# Patient Record
Sex: Female | Born: 1975
Health system: Southern US, Community
[De-identification: ages and names within clinical notes are randomized; demographics above are authoritative.]

## PROBLEM LIST (undated history)

## (undated) DIAGNOSIS — Z9289 Personal history of other medical treatment: Secondary | ICD-10-CM

## (undated) DIAGNOSIS — I10 Essential (primary) hypertension: Secondary | ICD-10-CM

## (undated) DIAGNOSIS — T7840XA Allergy, unspecified, initial encounter: Secondary | ICD-10-CM

## (undated) DIAGNOSIS — K649 Unspecified hemorrhoids: Secondary | ICD-10-CM

## (undated) HISTORY — DX: Unspecified hemorrhoids: K64.9

## (undated) HISTORY — PX: OTHER SURGICAL HISTORY: SHX169

## (undated) HISTORY — DX: Allergy, unspecified, initial encounter: T78.40XA

## (undated) HISTORY — DX: Essential (primary) hypertension: I10

## (undated) HISTORY — PX: TUBAL LIGATION: SHX77

---

## 1994-05-12 DIAGNOSIS — Z9289 Personal history of other medical treatment: Secondary | ICD-10-CM

## 1994-05-12 HISTORY — DX: Personal history of other medical treatment: Z92.89

## 2010-12-12 ENCOUNTER — Emergency Department (HOSPITAL_COMMUNITY): Payer: Managed Care, Other (non HMO)

## 2010-12-12 ENCOUNTER — Emergency Department (HOSPITAL_COMMUNITY)
Admission: EM | Admit: 2010-12-12 | Discharge: 2010-12-12 | Disposition: A | Discharge status: Home / Self Care | Payer: Managed Care, Other (non HMO) | Attending: Emergency Medicine | Admitting: Emergency Medicine

## 2010-12-12 DIAGNOSIS — W01119A Fall on same level from slipping, tripping and stumbling with subsequent striking against unspecified sharp object, initial encounter: Secondary | ICD-10-CM | POA: Insufficient documentation

## 2010-12-12 DIAGNOSIS — M79609 Pain in unspecified limb: Secondary | ICD-10-CM | POA: Insufficient documentation

## 2010-12-12 DIAGNOSIS — W268XXA Contact with other sharp object(s), not elsewhere classified, initial encounter: Secondary | ICD-10-CM | POA: Insufficient documentation

## 2010-12-12 DIAGNOSIS — S51809A Unspecified open wound of unspecified forearm, initial encounter: Secondary | ICD-10-CM | POA: Insufficient documentation

## 2010-12-12 DIAGNOSIS — S61409A Unspecified open wound of unspecified hand, initial encounter: Secondary | ICD-10-CM | POA: Insufficient documentation

## 2012-01-29 ENCOUNTER — Encounter (HOSPITAL_COMMUNITY): Payer: Self-pay | Admitting: *Deleted

## 2012-02-01 ENCOUNTER — Encounter (HOSPITAL_COMMUNITY): Payer: Self-pay | Admitting: Pharmacist

## 2012-02-04 ENCOUNTER — Other Ambulatory Visit: Payer: Self-pay | Admitting: Obstetrics & Gynecology

## 2012-02-11 NOTE — H&P (Signed)
Kaitlyn Stevens is an 36 y.o. G 4 P 5 female. She is admitted for hysteroscopy and Novasure endometrial ablation.  Pertinent Gynecological History:  She has been followed by me since 2008 with intermittent episodes of menorrhagia which have required 4 office D&C's to control over those years.  Sono hysterograms show a normal uterus with possible small recurrent polyps.  Her menorrhagia has not been controlled with oral contraceptives.  She did not want to try a Mirena.      Past Medical History  Diagnosis Date  . SVD (spontaneous vaginal delivery) 1994    twins  . History of blood transfusion 1996    with c/s done in [place]    Past Surgical History  Procedure Date  . Cesarean section 1996    x 1 twins    History reviewed. No pertinent family history.  Social History:  reports that she has never smoked. She has never used smokeless tobacco. She reports that she does not drink alcohol or use illicit drugs.  Allergies: No Known Allergies  No prescriptions prior to admission    ROS: non-contributory  Height 5' 4"  (1.626 m), weight 81.647 kg (180 lb). Physical Exam:  Abdomen is soft.  Pelvic shows no pathology.  Assessment/Plan: Menorrhagia poorly controlled with oral contraceptives.  Anemia (recent Hb 8.0 gms).  Possible 1.1 cm. uterine polyp seen on ultrasound. Will do hysteroscopy and Novasure Ablation.  Dyonna Jaspers D 02/11/2012, 3:14 PM

## 2012-02-13 ENCOUNTER — Encounter (HOSPITAL_COMMUNITY)
Admission: AD | Disposition: A | Discharge status: Home / Self Care | Payer: Self-pay | Source: Ambulatory Visit | Attending: Obstetrics & Gynecology

## 2012-02-13 ENCOUNTER — Ambulatory Visit (HOSPITAL_COMMUNITY)
Admission: AD | Admit: 2012-02-13 | Discharge: 2012-02-13 | Disposition: A | Discharge status: Home / Self Care | Payer: Managed Care, Other (non HMO) | Source: Ambulatory Visit | Attending: Obstetrics & Gynecology | Admitting: Obstetrics & Gynecology

## 2012-02-13 ENCOUNTER — Encounter (HOSPITAL_COMMUNITY): Payer: Self-pay | Admitting: Anesthesiology

## 2012-02-13 ENCOUNTER — Encounter (HOSPITAL_COMMUNITY): Payer: Self-pay | Admitting: *Deleted

## 2012-02-13 ENCOUNTER — Ambulatory Visit (HOSPITAL_COMMUNITY): Payer: Managed Care, Other (non HMO) | Admitting: Anesthesiology

## 2012-02-13 DIAGNOSIS — D649 Anemia, unspecified: Secondary | ICD-10-CM | POA: Insufficient documentation

## 2012-02-13 DIAGNOSIS — N840 Polyp of corpus uteri: Secondary | ICD-10-CM | POA: Insufficient documentation

## 2012-02-13 DIAGNOSIS — N920 Excessive and frequent menstruation with regular cycle: Secondary | ICD-10-CM | POA: Insufficient documentation

## 2012-02-13 HISTORY — DX: Personal history of other medical treatment: Z92.89

## 2012-02-13 HISTORY — PX: POLYPECTOMY: SHX5525

## 2012-02-13 LAB — CBC
HCT: 25.9 % — ABNORMAL LOW (ref 36.0–46.0)
MCH: 22.9 pg — ABNORMAL LOW (ref 26.0–34.0)
MCV: 73.2 fL — ABNORMAL LOW (ref 78.0–100.0)
RDW: 16.8 % — ABNORMAL HIGH (ref 11.5–15.5)
WBC: 6.5 10*3/uL (ref 4.0–10.5)

## 2012-02-13 SURGERY — HYSTEROSCOPY WITH NOVASURE
Anesthesia: General | Site: Uterus | Wound class: Clean Contaminated

## 2012-02-13 MED ORDER — KETOROLAC TROMETHAMINE 30 MG/ML IJ SOLN
INTRAMUSCULAR | Status: DC | PRN
  Administered 2012-02-13: 30 mg via INTRAVENOUS

## 2012-02-13 MED ORDER — MIDAZOLAM HCL 5 MG/5ML IJ SOLN
INTRAMUSCULAR | Status: DC | PRN
  Administered 2012-02-13: 2 mg via INTRAVENOUS

## 2012-02-13 MED ORDER — PROPOFOL 10 MG/ML IV EMUL
INTRAVENOUS | Status: AC
  Filled 2012-02-13: qty 20

## 2012-02-13 MED ORDER — LACTATED RINGERS IV SOLN
INTRAVENOUS | Status: DC
  Administered 2012-02-13: 50 mL/h via INTRAVENOUS

## 2012-02-13 MED ORDER — FENTANYL CITRATE 0.05 MG/ML IJ SOLN: 25.0000 ug | INTRAMUSCULAR | Status: DC | PRN

## 2012-02-13 MED ORDER — LACTATED RINGERS IV SOLN
INTRAVENOUS | Status: DC | PRN
  Administered 2012-02-13: 3000 mL via INTRAUTERINE

## 2012-02-13 MED ORDER — SUCCINYLCHOLINE CHLORIDE 20 MG/ML IJ SOLN
INTRAMUSCULAR | Status: DC | PRN
  Administered 2012-02-13: 10 mg via INTRAVENOUS

## 2012-02-13 MED ORDER — SUCCINYLCHOLINE CHLORIDE 20 MG/ML IJ SOLN
INTRAMUSCULAR | Status: AC
  Filled 2012-02-13: qty 10

## 2012-02-13 MED ORDER — LIDOCAINE HCL 2 % IJ SOLN
INTRAMUSCULAR | Status: AC
  Filled 2012-02-13: qty 20

## 2012-02-13 MED ORDER — LIDOCAINE HCL (CARDIAC) 20 MG/ML IV SOLN
INTRAVENOUS | Status: DC | PRN
  Administered 2012-02-13: 30 mg via INTRAVENOUS
  Administered 2012-02-13: 40 mg via INTRAVENOUS
  Administered 2012-02-13: 30 mg via INTRAVENOUS

## 2012-02-13 MED ORDER — LIDOCAINE HCL 2 % IJ SOLN
INTRAMUSCULAR | Status: DC | PRN
  Administered 2012-02-13: 20 mL

## 2012-02-13 MED ORDER — FENTANYL CITRATE 0.05 MG/ML IJ SOLN
INTRAMUSCULAR | Status: DC | PRN
  Administered 2012-02-13 (×2): 50 ug via INTRAVENOUS

## 2012-02-13 MED ORDER — METOCLOPRAMIDE HCL 5 MG/ML IJ SOLN: 10.0000 mg | Freq: Once | INTRAMUSCULAR | Status: DC | PRN

## 2012-02-13 MED ORDER — MIDAZOLAM HCL 2 MG/2ML IJ SOLN
INTRAMUSCULAR | Status: AC
Start: 2012-02-13 — End: 2012-02-13
  Filled 2012-02-13: qty 2

## 2012-02-13 MED ORDER — PROPOFOL 10 MG/ML IV EMUL
INTRAVENOUS | Status: DC | PRN
  Administered 2012-02-13: 200 mg via INTRAVENOUS

## 2012-02-13 MED ORDER — LIDOCAINE HCL (CARDIAC) 20 MG/ML IV SOLN
INTRAVENOUS | Status: AC
  Filled 2012-02-13: qty 5

## 2012-02-13 MED ORDER — ONDANSETRON HCL 4 MG/2ML IJ SOLN
INTRAMUSCULAR | Status: DC | PRN
  Administered 2012-02-13: 4 mg via INTRAVENOUS

## 2012-02-13 MED ORDER — ONDANSETRON HCL 4 MG/2ML IJ SOLN
INTRAMUSCULAR | Status: AC
  Filled 2012-02-13: qty 2

## 2012-02-13 MED ORDER — KETOROLAC TROMETHAMINE 30 MG/ML IJ SOLN
INTRAMUSCULAR | Status: AC
  Filled 2012-02-13: qty 1

## 2012-02-13 MED ORDER — FENTANYL CITRATE 0.05 MG/ML IJ SOLN
INTRAMUSCULAR | Status: AC
  Filled 2012-02-13: qty 2

## 2012-02-13 MED ORDER — MEPERIDINE HCL 25 MG/ML IJ SOLN: 6.2500 mg | INTRAMUSCULAR | Status: DC | PRN

## 2012-02-13 SURGICAL SUPPLY — 12 items
ABLATOR ENDOMETRIAL BIPOLAR (ABLATOR) ×2 IMPLANT
CATH ROBINSON RED A/P 16FR (CATHETERS) ×2 IMPLANT
CLOTH BEACON ORANGE TIMEOUT ST (SAFETY) ×2 IMPLANT
CONTAINER PREFILL 10% NBF 60ML (FORM) ×2 IMPLANT
DRESSING TELFA 8X3 (GAUZE/BANDAGES/DRESSINGS) ×2 IMPLANT
GLOVE ECLIPSE 6.0 STRL STRAW (GLOVE) ×4 IMPLANT
GOWN STRL REIN XL XLG (GOWN DISPOSABLE) ×6 IMPLANT
PACK HYSTEROSCOPY LF (CUSTOM PROCEDURE TRAY) ×2 IMPLANT
PAD OB MATERNITY 4.3X12.25 (PERSONAL CARE ITEMS) ×2 IMPLANT
PAD PREP 24X48 CUFFED NSTRL (MISCELLANEOUS) ×2 IMPLANT
TOWEL OR 17X24 6PK STRL BLUE (TOWEL DISPOSABLE) ×4 IMPLANT
WATER STERILE IRR 1000ML POUR (IV SOLUTION) ×2 IMPLANT

## 2012-02-13 NOTE — Op Note (Signed)
Patient Name: Kaitlyn Stevens MRN: [id number]  Date of Surgery: 02/13/2012    PREOPERATIVE DIAGNOSIS: MENORRHAGIA ENDOMETRIAL POLYP  POSTOPERATIVE DIAGNOSIS: MENORRHAGIA ENDOMETRIAL POLYP   PROCEDURE: Hysteroscopy, polypectomy, Novasure endometrial ablation.  SURGEON: Kamani Magnussen D. Deatra Ina M.D.  ANESTHESIA: General  ESTIMATED BLOOD LOSS: Minimal  FINDINGS: Uterus sounded to 8.2 cm, 1 cm polyp noted.   INDICATIONS: Menorrhagia, recurrent and not controlled with oral contraceptives.  PROCEDURE IN DETAIL: The patient was taken to the OR and placed in the dors-lithotomy position. The perineum and vagina were prepped and draped in a sterile fashion. The bladder was emptied. Bimanual exam revealed a normal sized anterior uterus. The cervix and uterus were sounded and the cavity depth was determined to be 5.5 cm. The internal cervical os was dilated with Kennon Rounds dilators to 21 [place]. The diagnostic hysteroscope was introduced and the cavity was inspected.  The polyp forceps was introduced and the polyp was removed and sent to pathology. The internal os was dilated further to 25 [place] and the Novasure device was inserted and deployed to a width of 4.7 cm. Ablation time was 110 sec. At a power of 136. The hysteroscope was reintroduced and the cavity was intact and well cauterized. The procedure was terminated and the patient left the OR in good condition.

## 2012-02-13 NOTE — Transfer of Care (Signed)
Immediate Anesthesia Transfer of Care Note  Patient: Kaitlyn Stevens  Procedure(s) Performed: Procedure(s) (LRB) with comments: HYSTEROSCOPY WITH NOVASURE (N/A) POLYPECTOMY (N/A)  Patient Location: PACU  Anesthesia Type: General  Level of Consciousness: awake, alert  and patient cooperative  Airway & Oxygen Therapy: Patient Spontanous Breathing and Patient connected to nasal cannula oxygen  Post-op Assessment: Report given to PACU RN  Post vital signs: Reviewed and stable  Complications: No apparent anesthesia complications

## 2012-02-13 NOTE — Anesthesia Preprocedure Evaluation (Addendum)
Anesthesia Evaluation  Patient identified by MRN, date of birth, ID band Patient awake    Reviewed: Allergy & Precautions, H&P , NPO status , Patient's Chart, lab work & pertinent test results  Airway Mallampati: III TM Distance: >3 FB Neck ROM: Full    Dental No notable dental hx. (+) Teeth Intact   Pulmonary neg pulmonary ROS,  breath sounds clear to auscultation  Pulmonary exam normal       Cardiovascular negative cardio ROS  Rhythm:Regular Rate:Normal     Neuro/Psych negative neurological ROS  negative psych ROS   GI/Hepatic negative GI ROS, Neg liver ROS,   Endo/Other  negative endocrine ROS  Renal/GU negative Renal ROS  negative genitourinary   Musculoskeletal negative musculoskeletal ROS (+)   Abdominal   Peds  Hematology negative hematology ROS (+)   Anesthesia Other Findings   Reproductive/Obstetrics Menorrhagia  Endometrial polyp                          Anesthesia Physical Anesthesia Plan  ASA: I  Anesthesia Plan: General   Post-op Pain Management:    Induction: Intravenous  Airway Management Planned: LMA  Additional Equipment:   Intra-op Plan:   Post-operative Plan: Extubation in OR  Informed Consent: I have reviewed the patients History and Physical, chart, labs and discussed the procedure including the risks, benefits and alternatives for the proposed anesthesia with the patient or authorized representative who has indicated his/her understanding and acceptance.   Dental advisory given  Plan Discussed with: Anesthesiologist, CRNA and Surgeon  Anesthesia Plan Comments:         Anesthesia Quick Evaluation

## 2012-02-13 NOTE — Anesthesia Postprocedure Evaluation (Signed)
Anesthesia Post Note  Patient: Kaitlyn Stevens  Procedure(s) Performed: Procedure(s) (LRB): HYSTEROSCOPY WITH NOVASURE (N/A) POLYPECTOMY (N/A)  Anesthesia type: GA  Patient location: PACU  Post pain: Pain level controlled  Post assessment: Post-op Vital signs reviewed  Last Vitals:  Filed Vitals:   02/13/12 0845  BP: 162/95  Pulse: 66  Temp:   Resp: 16    Post vital signs: Reviewed  Level of consciousness: sedated  Complications: No apparent anesthesia complications

## 2012-02-13 NOTE — Progress Notes (Signed)
I have interviewed and performed the pertinent exams on my patient to confirm that there have been no significant changes in her condition since the dictation of her history and physical exam.

## 2012-02-13 NOTE — Transfer of Care (Shared)
Immediate Anesthesia Transfer of Care Note  Patient: Kaitlyn Stevens  Procedure(s) Performed: Procedure(s) (LRB) with comments: HYSTEROSCOPY WITH NOVASURE (N/A) POLYPECTOMY (N/A)  Patient Location: {PLACES; ANE POST:19477::"PACU"}  Anesthesia Type: {PROCEDURES; ANE POST ANESTHESIA TYPE:19480}  Level of Consciousness: {FINDINGS; ANE POST LEVEL OF CONSCIOUSNESS:19484}  Airway & Oxygen Therapy: {Exam; oxygen device:30095}  Post-op Assessment: {ASSESSMENT;POST-OP FPKGYB:71278}  Post vital signs: {DESC; ANE POST NZUDOD:25500}  Complications: {FINDINGS; ANE POST COMPLICATIONS:19485}

## 2012-02-16 ENCOUNTER — Encounter (HOSPITAL_COMMUNITY): Payer: Self-pay | Admitting: Obstetrics & Gynecology

## 2013-02-20 ENCOUNTER — Emergency Department (HOSPITAL_COMMUNITY)
Admission: EM | Admit: 2013-02-20 | Discharge: 2013-02-20 | Disposition: A | Discharge status: Home / Self Care | Payer: No Typology Code available for payment source | Attending: Emergency Medicine | Admitting: Emergency Medicine

## 2013-02-20 ENCOUNTER — Encounter (HOSPITAL_COMMUNITY): Payer: Self-pay | Admitting: Emergency Medicine

## 2013-02-20 ENCOUNTER — Emergency Department (HOSPITAL_COMMUNITY): Payer: No Typology Code available for payment source

## 2013-02-20 DIAGNOSIS — S0990XA Unspecified injury of head, initial encounter: Secondary | ICD-10-CM | POA: Insufficient documentation

## 2013-02-20 DIAGNOSIS — M25531 Pain in right wrist: Secondary | ICD-10-CM

## 2013-02-20 DIAGNOSIS — S6990XA Unspecified injury of unspecified wrist, hand and finger(s), initial encounter: Secondary | ICD-10-CM | POA: Insufficient documentation

## 2013-02-20 DIAGNOSIS — S59909A Unspecified injury of unspecified elbow, initial encounter: Secondary | ICD-10-CM | POA: Insufficient documentation

## 2013-02-20 DIAGNOSIS — Y9389 Activity, other specified: Secondary | ICD-10-CM | POA: Insufficient documentation

## 2013-02-20 DIAGNOSIS — Y92410 Unspecified street and highway as the place of occurrence of the external cause: Secondary | ICD-10-CM | POA: Insufficient documentation

## 2013-02-20 MED ORDER — ACETAMINOPHEN 500 MG PO TABS
1000.0000 mg | ORAL_TABLET | Freq: Once | ORAL | Status: AC
  Administered 2013-02-20: 1000 mg via ORAL
  Filled 2013-02-20: qty 2

## 2013-02-20 MED ORDER — CYCLOBENZAPRINE HCL 10 MG PO TABS: 10.0000 mg | ORAL_TABLET | Freq: Two times a day (BID) | ORAL | Status: DC | PRN

## 2013-02-20 MED ORDER — IBUPROFEN 400 MG PO TABS
400.0000 mg | ORAL_TABLET | Freq: Once | ORAL | Status: AC
Start: 2013-02-20 — End: 2013-02-20
  Administered 2013-02-20: 400 mg via ORAL
  Filled 2013-02-20: qty 1

## 2013-02-20 NOTE — ED Provider Notes (Signed)
Medical screening examination/treatment/procedure(s) were performed by non-physician practitioner and as supervising physician I was immediately available for consultation/collaboration.   Elyn Peers, MD 02/20/13 2337
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
## 2013-03-28 ENCOUNTER — Emergency Department (HOSPITAL_BASED_OUTPATIENT_CLINIC_OR_DEPARTMENT_OTHER): Payer: Managed Care, Other (non HMO)

## 2013-03-28 ENCOUNTER — Emergency Department (HOSPITAL_BASED_OUTPATIENT_CLINIC_OR_DEPARTMENT_OTHER)
Admission: EM | Admit: 2013-03-28 | Discharge: 2013-03-28 | Disposition: A | Discharge status: Home / Self Care | Payer: Managed Care, Other (non HMO) | Attending: Emergency Medicine | Admitting: Emergency Medicine

## 2013-03-28 ENCOUNTER — Encounter (HOSPITAL_BASED_OUTPATIENT_CLINIC_OR_DEPARTMENT_OTHER): Payer: Self-pay | Admitting: Emergency Medicine

## 2013-03-28 DIAGNOSIS — H538 Other visual disturbances: Secondary | ICD-10-CM | POA: Diagnosis not present

## 2013-03-28 DIAGNOSIS — R51 Headache: Secondary | ICD-10-CM | POA: Insufficient documentation

## 2013-03-28 DIAGNOSIS — I10 Essential (primary) hypertension: Secondary | ICD-10-CM | POA: Diagnosis present

## 2013-03-28 DIAGNOSIS — Z3202 Encounter for pregnancy test, result negative: Secondary | ICD-10-CM | POA: Insufficient documentation

## 2013-03-28 DIAGNOSIS — R519 Headache, unspecified: Secondary | ICD-10-CM

## 2013-03-28 LAB — CBC WITH DIFFERENTIAL/PLATELET
Basophils Relative: 0 % (ref 0–1)
Eosinophils Absolute: 0.2 10*3/uL (ref 0.0–0.7)
Eosinophils Relative: 3 % (ref 0–5)
Hemoglobin: 12.4 g/dL (ref 12.0–15.0)
MCH: 28.5 pg (ref 26.0–34.0)
MCHC: 34.3 g/dL (ref 30.0–36.0)
MCV: 83 fL (ref 78.0–100.0)
Monocytes Relative: 9 % (ref 3–12)
Neutrophils Relative %: 49 % (ref 43–77)
Platelets: 322 10*3/uL (ref 150–400)

## 2013-03-28 LAB — URINE MICROSCOPIC-ADD ON

## 2013-03-28 LAB — URINALYSIS, ROUTINE W REFLEX MICROSCOPIC
Ketones, ur: NEGATIVE mg/dL
Nitrite: NEGATIVE
Protein, ur: NEGATIVE mg/dL

## 2013-03-28 LAB — BASIC METABOLIC PANEL
BUN: 9 mg/dL (ref 6–23)
Calcium: 9 mg/dL (ref 8.4–10.5)
GFR calc Af Amer: >90 mL/min (ref >90)
GFR calc non Af Amer: 80 mL/min — ABNORMAL LOW (ref >90)
Potassium: 3.3 mEq/L — ABNORMAL LOW (ref 3.5–5.1)

## 2013-03-28 MED ORDER — KETOROLAC TROMETHAMINE 60 MG/2ML IM SOLN
60.0000 mg | Freq: Once | INTRAMUSCULAR | Status: AC
  Administered 2013-03-28: 60 mg via INTRAMUSCULAR
  Filled 2013-03-28: qty 2

## 2013-03-28 MED ORDER — TRAMADOL HCL 50 MG PO TABS: 50.0000 mg | ORAL_TABLET | Freq: Four times a day (QID) | ORAL | Status: DC | PRN

## 2013-03-28 NOTE — ED Notes (Signed)
Checked blood pressure at work was reading high at work mother has history of hypertension
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
## 2013-03-29 LAB — URINE CULTURE

## 2013-09-29 ENCOUNTER — Encounter: Payer: Self-pay | Admitting: Cardiovascular Disease

## 2013-09-29 DIAGNOSIS — Z9289 Personal history of other medical treatment: Secondary | ICD-10-CM | POA: Insufficient documentation

## 2013-09-30 ENCOUNTER — Ambulatory Visit (INDEPENDENT_AMBULATORY_CARE_PROVIDER_SITE_OTHER): Payer: Managed Care, Other (non HMO) | Admitting: Cardiovascular Disease

## 2013-09-30 VITALS — BP 148/90 | HR 84 | Ht 64.0 in | Wt 196.4 lb

## 2013-09-30 DIAGNOSIS — R011 Cardiac murmur, unspecified: Secondary | ICD-10-CM

## 2013-09-30 DIAGNOSIS — I10 Essential (primary) hypertension: Secondary | ICD-10-CM | POA: Insufficient documentation

## 2013-09-30 NOTE — Patient Instructions (Signed)
Your physician recommends that you schedule a follow-up appointment in:  AS NEEDED Your physician recommends that you continue on your current medications as directed. Please refer to the Current Medication list given to you today. Your physician has requested that you have an echocardiogram. Echocardiography is a painless test that uses sound waves to create images of your heart. It provides your doctor with information about the size and shape of your heart and how well your heart's chambers and valves are working. This procedure takes approximately one hour. There are no restrictions for this procedure.

## 2013-09-30 NOTE — Progress Notes (Signed)
Patient ID: Kaitlyn Stevens, female   DOB: Sep 19, 1975, 38 y.o.   MRN: [id number]   38 yo with HTN  ON 3 drug Rx now with improvement  Recent BMET with K 3.6 AND cR .94  Has twher o sets of twins ages 12 and 57.  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  At [facility] BP over 493 systolic.  Had some headaches.  No chest pain or dyspnea  HTN on both sides of the family and CHF on dad's side.  Has tried to minimize/deny issue.  Compliant with meds.  BP much improved ECG in office today shows no LVH.  She is trying to be better about  Her diet but still sedentary     ROS: Denies fever, malais, weight loss, blurry vision, decreased visual acuity, cough, sputum, SOB, hemoptysis, pleuritic pain, palpitaitons, heartburn, abdominal pain, melena, lower extremity edema, claudication, or rash.  All other systems reviewed and negative   General: Affect appropriate Overweight black female  HEENT: normal Neck supple with no adenopathy JVP normal no bruits no thyromegaly Lungs clear with no wheezing and good diaphragmatic motion Heart:  S1/S2 2/6 SEM  murmur,rub, gallop or click PMI normal Abdomen: benighn, BS positve, no tenderness, no AAA no bruit.  No HSM or HJR Distal pulses intact with no bruits No edema Neuro non-focal Skin warm and dry No muscular weakness  Medications Current Outpatient Prescriptions  Medication Sig Dispense Refill  . amLODipine (NORVASC) 5 MG tablet Take 5 mg by mouth daily.       . valsartan-hydrochlorothiazide (DIOVAN-HCT) 160-25 MG per tablet Take 1 tablet by mouth daily.        No current facility-administered medications for this visit.    Allergies Review of patient's allergies indicates no known allergies.  Family History: No family history on file.  Social History: History   Social History  . Marital Status: Single    Spouse Name: N/A    Number of Children: N/A  . Years of  Education: N/A   Occupational History  . Not on file.   Social History Main Topics  . Smoking status: Never Smoker   . Smokeless tobacco: Never Used  . Alcohol Use: No  . Drug Use: No  . Sexual Activity: Yes    Birth Control/ Protection: Surgical   Other Topics Concern  . Not on file   Social History Narrative  . No narrative on file    Electrocardiogram: SR  RATE 84 RAD  NORMAL   Assessment and Plan

## 2013-09-30 NOTE — Assessment & Plan Note (Signed)
SEM likely benign Not previously described Echo

## 2013-09-30 NOTE — Assessment & Plan Note (Signed)
On good Rx  Discussed lifestyle modifications Given how high BP had been would not push meds too much next 6 months.  Echo to assess LV mass and make sure EF normal  No LVH on ECG Don't think elaborate w/u for secondary causes of HTN needed as K/Cr normal , family history of HTN and was also pre eclamtic

## 2013-10-20 ENCOUNTER — Ambulatory Visit (HOSPITAL_COMMUNITY): Payer: Managed Care, Other (non HMO) | Attending: Internal Medicine | Admitting: Radiology

## 2013-10-20 DIAGNOSIS — R011 Cardiac murmur, unspecified: Secondary | ICD-10-CM

## 2013-10-20 DIAGNOSIS — I10 Essential (primary) hypertension: Secondary | ICD-10-CM

## 2013-10-20 NOTE — Progress Notes (Signed)
Echocardiogram performed.  
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
## 2016-09-24 LAB — HM PAP SMEAR: HM PAP: NORMAL

## 2016-09-24 LAB — HM MAMMOGRAPHY: HM MAMMO: NORMAL (ref 0–4)

## 2016-11-03 ENCOUNTER — Telehealth: Payer: Self-pay | Admitting: Behavioral Health

## 2016-11-03 ENCOUNTER — Encounter: Payer: Self-pay | Admitting: Behavioral Health

## 2016-11-03 NOTE — Telephone Encounter (Signed)
Pre-Visit Call completed with patient and chart updated.   Pre-Visit Info documented in Specialty Comments under [employer].    

## 2016-11-05 ENCOUNTER — Encounter: Payer: Self-pay | Admitting: Family Medicine

## 2016-11-05 ENCOUNTER — Ambulatory Visit (INDEPENDENT_AMBULATORY_CARE_PROVIDER_SITE_OTHER): Payer: 59 | Admitting: Family Medicine

## 2016-11-05 VITALS — BP 218/100 | HR 77 | Temp 98.3°F | Ht 64.0 in | Wt 199.8 lb

## 2016-11-05 DIAGNOSIS — I10 Essential (primary) hypertension: Secondary | ICD-10-CM | POA: Diagnosis not present

## 2016-11-05 DIAGNOSIS — R0683 Snoring: Secondary | ICD-10-CM

## 2016-11-05 LAB — COMPREHENSIVE METABOLIC PANEL
ALBUMIN: 4.1 g/dL (ref 3.5–5.2)
ALK PHOS: 67 U/L (ref 39–117)
ALT: 17 U/L (ref 0–35)
AST: 18 U/L (ref 0–37)
BILIRUBIN TOTAL: 0.4 mg/dL (ref 0.2–1.2)
BUN: 8 mg/dL (ref 6–23)
CALCIUM: 9.2 mg/dL (ref 8.4–10.5)
CO2: 29 mEq/L (ref 19–32)
Chloride: 104 mEq/L (ref 96–112)
Creatinine, Ser: 0.86 mg/dL (ref 0.40–1.20)
GFR: 92.68 mL/min (ref >60.00)
Glucose, Bld: 119 mg/dL — ABNORMAL HIGH (ref 70–99)
Potassium: 3.5 mEq/L (ref 3.5–5.1)
Sodium: 138 mEq/L (ref 135–145)
TOTAL PROTEIN: 7.5 g/dL (ref 6.0–8.3)

## 2016-11-05 MED ORDER — VALSARTAN-HYDROCHLOROTHIAZIDE 160-25 MG PO TABS: 1.0000 | ORAL_TABLET | Freq: Every day | ORAL | 2 refills | Status: DC

## 2016-11-05 NOTE — Progress Notes (Signed)
Chief Complaint  Patient presents with  . Establish Care    pt want to discuss BP-pt states she was on BP meds but stop taking       New Patient Visit SUBJECTIVE: HPI: Kaitlyn Stevens is an 41 y.o.female who is being seen for establishing care.  The patient was previously seen at [facility].  Hypertension Patient presents for hypertension follow up. She does not routinely monitor home blood pressures. She used to be on BP medicine, but has not been taking it. She used to be on Norvasc and Valsartan-HCTZ- she stopped them because it was hard for her to get a refill. It's been a year since she took it.   She is not adhering to a healthy diet overall. Exercise: walking, Zumba, daily Denies CP or SOB.  +Famhx of HTN. She does complain of snoring and daytime fatigue.  No Known Allergies  Past Medical History:  Diagnosis Date  . History of blood transfusion 1996   with c/s done in [place]  . SVD (spontaneous vaginal delivery) 1994   twins   Past Surgical History:  Procedure Laterality Date  . CESAREAN SECTION  1996   x 1 twins  . POLYPECTOMY  02/13/2012   Procedure: POLYPECTOMY;  Surgeon: Sharene Butters, MD;  Location: [facility];  Service: Gynecology;  Laterality: N/A;  . TUBAL LIGATION     Social History   Social History  . Marital status: Single   Social History Main Topics  . Smoking status: Never Smoker  . Smokeless tobacco: Never Used  . Alcohol use No  . Drug use: No  . Sexual activity: Yes    Birth control/ protection: Surgical   Family History  Problem Relation Age of Onset  . Diabetes Mother   . Hypertension Mother   . Congenital heart disease Paternal Grandmother      Current Outpatient Prescriptions:  .  amLODipine (NORVASC) 5 MG tablet, Take 5 mg by mouth daily. , Disp: , Rfl:  .  valsartan-hydrochlorothiazide (DIOVAN-HCT) 160-25 MG tablet, Take 1 tablet by mouth daily., Disp: 30 tablet, Rfl: 2  Patient's last menstrual period was 10/10/2016  (approximate).  ROS Cardiovascular: Denies chest pain  Respiratory: Denies dyspnea   OBJECTIVE: BP (!) 218/100 (BP Location: Left Arm, Patient Position: Sitting, Cuff Size: Normal)   Pulse 77   Temp 98.3 F (36.8 C) (Oral)   Ht 5' 4"  (1.626 m)   Wt 199 lb 12.8 oz (90.6 kg)   LMP 10/10/2016 (Approximate)   SpO2 98%   BMI 34.30 kg/m   Constitutional: -  VS reviewed -  Well developed, well nourished, appears stated age -  No apparent distress  Psychiatric: -  Oriented to person, place, and time -  Memory intact -  Affect and mood normal -  Fluent conversation, good eye contact -  Judgment and insight age appropriate  Eye: -  Conjunctivae clear, no discharge -  Pupils symmetric, round, reactive to light  ENMT: -  Oral mucosa without lesions, tongue and uvula midline    Tonsils not enlarged, no erythema, no exudate, trachea midline    Pharynx moist, no lesions, no erythema  Neck: -  No gross swelling, no palpable masses -  Thyroid midline, not enlarged, mobile, no palpable masses  Cardiovascular: -  RRR, I did not hear a murmur, no LE edema, no bruits -  No LE edema  Respiratory: -  Normal respiratory effort, no accessory muscle use, no retraction -  Breath sounds equal,  no wheezes, no ronchi, no crackles  Musculoskeletal: -  No clubbing, no cyanosis -  Gait normal  Skin: -  No significant lesion on inspection -  Warm and dry to palpation   ASSESSMENT/PLAN: Essential hypertension - Plan: Comprehensive metabolic panel, valsartan-hydrochlorothiazide (DIOVAN-HCT) 160-25 MG tablet  Snoring - Plan: Ambulatory referral to Pulmonology  Needs to start back on medicine. Follows with GYN for women's health. UTD with imms. Refer to sleep medicine, OSA could be contributing to BP issue. No evidence of CAD. Patient should return in 1 week for lab visit, 4 weeks for visit with me. Will add back Norvasc if still uncontrolled.  The patient voiced understanding and agreement to the  plan.   [place], DO 11/05/16  9:11 AM

## 2016-11-05 NOTE — Patient Instructions (Addendum)
If you do not hear anything about your referral in the next 1-2 weeks, call our office and ask for an update.  Give [place] 2-3 business days to get the results of your labs back.   I challenge you to lift weights.  Get set up with MyChart!   DASH Eating Plan DASH stands for "Dietary Approaches to Stop Hypertension." The DASH eating plan is a healthy eating plan that has been shown to reduce high blood pressure (hypertension). It may also reduce your risk for type 2 diabetes, heart disease, and stroke. The DASH eating plan may also help with weight loss. What are tips for following this plan? General guidelines  Avoid eating more than 2,300 mg (milligrams) of salt (sodium) a day. If you have hypertension, you may need to reduce your sodium intake to 1,500 mg a day.  Limit alcohol intake to no more than 1 drink a day for nonpregnant women and 2 drinks a day for men. One drink equals 12 oz of beer, 5 oz of wine, or 1 oz of hard liquor.  Work with your health care provider to maintain a healthy body weight or to lose weight. Ask what an ideal weight is for you.  Get at least 30 minutes of exercise that causes your heart to beat faster (aerobic exercise) most days of the week. Activities may include walking, swimming, or biking.  Work with your health care provider or diet and nutrition specialist (dietitian) to adjust your eating plan to your individual calorie needs. Reading food labels  Check food labels for the amount of sodium per serving. Choose foods with less than 5 percent of the Daily Value of sodium. Generally, foods with less than 300 mg of sodium per serving fit into this eating plan.  To find whole grains, look for the word "whole" as the first word in the ingredient list. Shopping  Buy products labeled as "low-sodium" or "no salt added."  Buy fresh foods. Avoid canned foods and premade or frozen meals. Cooking  Avoid adding salt when cooking. Use salt-free seasonings or  herbs instead of table salt or sea salt. Check with your health care provider or pharmacist before using salt substitutes.  Do not fry foods. Cook foods using healthy methods such as baking, boiling, grilling, and broiling instead.  Cook with heart-healthy oils, such as olive, canola, soybean, or sunflower oil. Meal planning   Eat a balanced diet that includes: ? 5 or more servings of fruits and vegetables each day. At each meal, try to fill half of your plate with fruits and vegetables. ? Up to 6-8 servings of whole grains each day. ? Less than 6 oz of lean meat, poultry, or fish each day. A 3-oz serving of meat is about the same size as a deck of cards. One egg equals 1 oz. ? 2 servings of low-fat dairy each day. ? A serving of nuts, seeds, or beans 5 times each week. ? Heart-healthy fats. Healthy fats called Omega-3 fatty acids are found in foods such as flaxseeds and coldwater fish, like sardines, salmon, and mackerel.  Limit how much you eat of the following: ? Canned or prepackaged foods. ? Food that is high in trans fat, such as fried foods. ? Food that is high in saturated fat, such as fatty meat. ? Sweets, desserts, sugary drinks, and other foods with added sugar. ? Full-fat dairy products.  Do not salt foods before eating.  Try to eat at least 2 vegetarian meals each  week.  Eat more home-cooked food and less restaurant, buffet, and fast food.  When eating at a restaurant, ask that your food be prepared with less salt or no salt, if possible. What foods are recommended? The items listed may not be a complete list. Talk with your dietitian about what dietary choices are best for you. Grains Whole-grain or whole-wheat bread. Whole-grain or whole-wheat pasta. Brown rice. [place]. Bulgur. Whole-grain and low-sodium cereals. Pita bread. Low-fat, low-sodium crackers. Whole-wheat flour tortillas. Vegetables Fresh or frozen vegetables (raw, steamed, roasted, or grilled).  Low-sodium or reduced-sodium tomato and vegetable juice. Low-sodium or reduced-sodium tomato sauce and tomato paste. Low-sodium or reduced-sodium canned vegetables. Fruits All fresh, dried, or frozen fruit. Canned fruit in natural juice (without added sugar). Meat and other protein foods Skinless chicken or [place]. Ground chicken or [place]. Pork with fat trimmed off. Fish and seafood. Egg whites. Dried beans, peas, or lentils. Unsalted nuts, nut butters, and seeds. Unsalted canned beans. Lean cuts of beef with fat trimmed off. Low-sodium, lean deli meat. Dairy Low-fat (1%) or fat-free (skim) milk. Fat-free, low-fat, or reduced-fat cheeses. Nonfat, low-sodium ricotta or cottage cheese. Low-fat or nonfat yogurt. Low-fat, low-sodium cheese. Fats and oils Soft margarine without trans fats. Vegetable oil. Low-fat, reduced-fat, or light mayonnaise and salad dressings (reduced-sodium). Canola, safflower, olive, soybean, and sunflower oils. Avocado. Seasoning and other foods Herbs. Spices. Seasoning mixes without salt. Unsalted popcorn and pretzels. Fat-free sweets. What foods are not recommended? The items listed may not be a complete list. Talk with your dietitian about what dietary choices are best for you. Grains Baked goods made with fat, such as croissants, muffins, or some breads. Dry pasta or rice meal packs. Vegetables Creamed or fried vegetables. Vegetables in a cheese sauce. Regular canned vegetables (not low-sodium or reduced-sodium). Regular canned tomato sauce and paste (not low-sodium or reduced-sodium). Regular tomato and vegetable juice (not low-sodium or reduced-sodium). Kaitlyn Stevens. Olives. Fruits Canned fruit in a light or heavy syrup. Fried fruit. Fruit in cream or butter sauce. Meat and other protein foods Fatty cuts of meat. Ribs. Fried meat. Kaitlyn Stevens. Sausage. Bologna and other processed lunch meats. Salami. Fatback. Hotdogs. Bratwurst. Salted nuts and seeds. Canned beans with added  salt. Canned or smoked fish. Whole eggs or egg yolks. Chicken or [place] with skin. Dairy Whole or 2% milk, cream, and half-and-half. Whole or full-fat cream cheese. Whole-fat or sweetened yogurt. Full-fat cheese. Nondairy creamers. Whipped toppings. Processed cheese and cheese spreads. Fats and oils Butter. Stick margarine. Lard. Shortening. Ghee. Bacon fat. Tropical oils, such as coconut, palm kernel, or palm oil. Seasoning and other foods Salted popcorn and pretzels. Onion salt, garlic salt, seasoned salt, table salt, and sea salt. Worcestershire sauce. Tartar sauce. Barbecue sauce. Teriyaki sauce. Soy sauce, including reduced-sodium. Steak sauce. Canned and packaged gravies. Fish sauce. Oyster sauce. Cocktail sauce. Horseradish that you find on the shelf. Ketchup. Mustard. Meat flavorings and tenderizers. Bouillon cubes. Hot sauce and Tabasco sauce. Premade or packaged marinades. Premade or packaged taco seasonings. Relishes. Regular salad dressings. Where to find more information:  National Heart, Lung, and [place]: https://wilson-eaton.com/  American Heart Association: www.heart.org Summary  The DASH eating plan is a healthy eating plan that has been shown to reduce high blood pressure (hypertension). It may also reduce your risk for type 2 diabetes, heart disease, and stroke.  With the DASH eating plan, you should limit salt (sodium) intake to 2,300 mg a day. If you have hypertension, you may need to reduce your  sodium intake to 1,500 mg a day.  When on the DASH eating plan, aim to eat more fresh fruits and vegetables, whole grains, lean proteins, low-fat dairy, and heart-healthy fats.  Work with your health care provider or diet and nutrition specialist (dietitian) to adjust your eating plan to your individual calorie needs. This information is not intended to replace advice given to you by your health care provider. Make sure you discuss any questions you have with your health care  provider. Document Released: 04/17/2011 Document Revised: 04/21/2016 Document Reviewed: 04/21/2016 Elsevier Interactive Patient Education  2017 [employer].

## 2016-12-03 ENCOUNTER — Encounter: Payer: Self-pay | Admitting: Family Medicine

## 2016-12-03 ENCOUNTER — Ambulatory Visit (INDEPENDENT_AMBULATORY_CARE_PROVIDER_SITE_OTHER): Payer: 59 | Admitting: Family Medicine

## 2016-12-03 VITALS — BP 170/110 | HR 62 | Temp 98.0°F | Ht 64.0 in | Wt 202.6 lb

## 2016-12-03 DIAGNOSIS — I10 Essential (primary) hypertension: Secondary | ICD-10-CM | POA: Diagnosis not present

## 2016-12-03 DIAGNOSIS — T753XXA Motion sickness, initial encounter: Secondary | ICD-10-CM

## 2016-12-03 MED ORDER — AMLODIPINE BESYLATE 5 MG PO TABS: 5.0000 mg | ORAL_TABLET | Freq: Every day | ORAL | 2 refills | Status: DC

## 2016-12-03 MED ORDER — LOSARTAN POTASSIUM-HCTZ 100-25 MG PO TABS: 1.0000 | ORAL_TABLET | Freq: Every day | ORAL | 2 refills | Status: DC

## 2016-12-03 MED ORDER — SCOPOLAMINE 1 MG/3DAYS TD PT72: 1.0000 | MEDICATED_PATCH | TRANSDERMAL | 0 refills | Status: DC

## 2016-12-03 MED FILL — AMLODIPINE BESYLATE 5 MG TA: 5 | 30 days supply | Qty: 30 | Fill #0

## 2016-12-03 MED FILL — LOSARTAN-HCTZ 100-25 MG TAB: 100-25 | 30 days supply | Qty: 30 | Fill #0

## 2016-12-03 NOTE — Patient Instructions (Addendum)
Start walking and lifting weights.  Take your medicine every day.  Around 3 times per week, check your blood pressure 4 times per day. Twice in the morning and twice in the evening. The readings should be at least one minute apart. Write down these values and bring them to your next nurse visit/appointment.  When you check your BP, make sure you have been doing something calm/relaxing 5 minutes prior to checking. Both feet should be flat on the floor and you should be sitting. Use your left arm and make sure it is in a relaxed position (on a table), and that the cuff is at the approximate level/height of your heart.  DASH Eating Plan DASH stands for "Dietary Approaches to Stop Hypertension." The DASH eating plan is a healthy eating plan that has been shown to reduce high blood pressure (hypertension). It may also reduce your risk for type 2 diabetes, heart disease, and stroke. The DASH eating plan may also help with weight loss. What are tips for following this plan? General guidelines  Avoid eating more than 2,300 mg (milligrams) of salt (sodium) a day. If you have hypertension, you may need to reduce your sodium intake to 1,500 mg a day.  Limit alcohol intake to no more than 1 drink a day for nonpregnant women and 2 drinks a day for men. One drink equals 12 oz of beer, 5 oz of wine, or 1 oz of hard liquor.  Work with your health care provider to maintain a healthy body weight or to lose weight. Ask what an ideal weight is for you.  Get at least 30 minutes of exercise that causes your heart to beat faster (aerobic exercise) most days of the week. Activities may include walking, swimming, or biking.  Work with your health care provider or diet and nutrition specialist (dietitian) to adjust your eating plan to your individual calorie needs. Reading food labels  Check food labels for the amount of sodium per serving. Choose foods with less than 5 percent of the Daily Value of sodium.  Generally, foods with less than 300 mg of sodium per serving fit into this eating plan.  To find whole grains, look for the word "whole" as the first word in the ingredient list. Shopping  Buy products labeled as "low-sodium" or "no salt added."  Buy fresh foods. Avoid canned foods and premade or frozen meals. Cooking  Avoid adding salt when cooking. Use salt-free seasonings or herbs instead of table salt or sea salt. Check with your health care provider or pharmacist before using salt substitutes.  Do not fry foods. Cook foods using healthy methods such as baking, boiling, grilling, and broiling instead.  Cook with heart-healthy oils, such as olive, canola, soybean, or sunflower oil. Meal planning   Eat a balanced diet that includes: ? 5 or more servings of fruits and vegetables each day. At each meal, try to fill half of your plate with fruits and vegetables. ? Up to 6-8 servings of whole grains each day. ? Less than 6 oz of lean meat, poultry, or fish each day. A 3-oz serving of meat is about the same size as a deck of cards. One egg equals 1 oz. ? 2 servings of low-fat dairy each day. ? A serving of nuts, seeds, or beans 5 times each week. ? Heart-healthy fats. Healthy fats called Omega-3 fatty acids are found in foods such as flaxseeds and coldwater fish, like sardines, salmon, and mackerel.  Limit how much you eat  of the following: ? Canned or prepackaged foods. ? Food that is high in trans fat, such as fried foods. ? Food that is high in saturated fat, such as fatty meat. ? Sweets, desserts, sugary drinks, and other foods with added sugar. ? Full-fat dairy products.  Do not salt foods before eating.  Try to eat at least 2 vegetarian meals each week.  Eat more home-cooked food and less restaurant, buffet, and fast food.  When eating at a restaurant, ask that your food be prepared with less salt or no salt, if possible. What foods are recommended? The items listed may  not be a complete list. Talk with your dietitian about what dietary choices are best for you. Grains Whole-grain or whole-wheat bread. Whole-grain or whole-wheat pasta. Brown rice. [place]. Bulgur. Whole-grain and low-sodium cereals. Pita bread. Low-fat, low-sodium crackers. Whole-wheat flour tortillas. Vegetables Fresh or frozen vegetables (raw, steamed, roasted, or grilled). Low-sodium or reduced-sodium tomato and vegetable juice. Low-sodium or reduced-sodium tomato sauce and tomato paste. Low-sodium or reduced-sodium canned vegetables. Fruits All fresh, dried, or frozen fruit. Canned fruit in natural juice (without added sugar). Meat and other protein foods Skinless chicken or [place]. Ground chicken or [place]. Pork with fat trimmed off. Fish and seafood. Egg whites. Dried beans, peas, or lentils. Unsalted nuts, nut butters, and seeds. Unsalted canned beans. Lean cuts of beef with fat trimmed off. Low-sodium, lean deli meat. Dairy Low-fat (1%) or fat-free (skim) milk. Fat-free, low-fat, or reduced-fat cheeses. Nonfat, low-sodium ricotta or cottage cheese. Low-fat or nonfat yogurt. Low-fat, low-sodium cheese. Fats and oils Soft margarine without trans fats. Vegetable oil. Low-fat, reduced-fat, or light mayonnaise and salad dressings (reduced-sodium). Canola, safflower, olive, soybean, and sunflower oils. Avocado. Seasoning and other foods Herbs. Spices. Seasoning mixes without salt. Unsalted popcorn and pretzels. Fat-free sweets. What foods are not recommended? The items listed may not be a complete list. Talk with your dietitian about what dietary choices are best for you. Grains Baked goods made with fat, such as croissants, muffins, or some breads. Dry pasta or rice meal packs. Vegetables Creamed or fried vegetables. Vegetables in a cheese sauce. Regular canned vegetables (not low-sodium or reduced-sodium). Regular canned tomato sauce and paste (not low-sodium or reduced-sodium).  Regular tomato and vegetable juice (not low-sodium or reduced-sodium). Angie Fava. Olives. Fruits Canned fruit in a light or heavy syrup. Fried fruit. Fruit in cream or butter sauce. Meat and other protein foods Fatty cuts of meat. Ribs. Fried meat. Berniece Salines. Sausage. Bologna and other processed lunch meats. Salami. Fatback. Hotdogs. Bratwurst. Salted nuts and seeds. Canned beans with added salt. Canned or smoked fish. Whole eggs or egg yolks. Chicken or [place] with skin. Dairy Whole or 2% milk, cream, and half-and-half. Whole or full-fat cream cheese. Whole-fat or sweetened yogurt. Full-fat cheese. Nondairy creamers. Whipped toppings. Processed cheese and cheese spreads. Fats and oils Butter. Stick margarine. Lard. Shortening. Ghee. Bacon fat. Tropical oils, such as coconut, palm kernel, or palm oil. Seasoning and other foods Salted popcorn and pretzels. Onion salt, garlic salt, seasoned salt, table salt, and sea salt. Worcestershire sauce. Tartar sauce. Barbecue sauce. Teriyaki sauce. Soy sauce, including reduced-sodium. Steak sauce. Canned and packaged gravies. Fish sauce. Oyster sauce. Cocktail sauce. Horseradish that you find on the shelf. Ketchup. Mustard. Meat flavorings and tenderizers. Bouillon cubes. Hot sauce and Tabasco sauce. Premade or packaged marinades. Premade or packaged taco seasonings. Relishes. Regular salad dressings. Where to find more information:  National Heart, Lung, and Blood Institute: https://wilson-eaton.com/  American Heart  Association: www.heart.org Summary  The DASH eating plan is a healthy eating plan that has been shown to reduce high blood pressure (hypertension). It may also reduce your risk for type 2 diabetes, heart disease, and stroke.  With the DASH eating plan, you should limit salt (sodium) intake to 2,300 mg a day. If you have hypertension, you may need to reduce your sodium intake to 1,500 mg a day.  When on the DASH eating plan, aim to eat more fresh fruits and  vegetables, whole grains, lean proteins, low-fat dairy, and heart-healthy fats.  Work with your health care provider or diet and nutrition specialist (dietitian) to adjust your eating plan to your individual calorie needs. This information is not intended to replace advice given to you by your health care provider. Make sure you discuss any questions you have with your health care provider. Document Released: 04/17/2011 Document Revised: 04/21/2016 Document Reviewed: 04/21/2016 Elsevier Interactive Patient Education  2017 [employer].

## 2016-12-03 NOTE — Progress Notes (Signed)
Chief Complaint  Patient presents with  . Follow-up    4 weeks on BP    Subjective Kaitlyn Stevens is a 41 y.o. female who presents for hypertension follow up. She does not monitor home blood pressures. She is compliant with medications. Patient has these side effects of medication: none She is adhering to a healthy diet overall. Current exercise: 5 days a week, walking Denies chest pain or shortness of breath.   Past Medical History:  Diagnosis Date  . Essential hypertension   . History of blood transfusion 1996   with c/s done in [place]  . SVD (spontaneous vaginal delivery) 1994   twins   Family History  Problem Relation Age of Onset  . Diabetes Mother   . Hypertension Mother   . Congenital heart disease Paternal Grandmother      Medications Current Outpatient Prescriptions on File Prior to Visit  Medication Sig Dispense Refill  . valsartan-hydrochlorothiazide (DIOVAN-HCT) 160-25 MG tablet Take 1 tablet by mouth daily. 30 tablet 2   Allergies No Known Allergies  Review of Systems Cardiovascular: no chest pain Respiratory:  no shortness of breath  Exam BP (!) 170/110 (BP Location: Left Arm, Patient Position: Sitting, Cuff Size: Normal)   Pulse 62   Temp 98 F (36.7 C) (Oral)   Ht 5' 4"  (1.626 m)   Wt 202 lb 9.6 oz (91.9 kg)   SpO2 99%   BMI 34.78 kg/m  General:  well developed, well nourished, in no apparent distress Skin:  warm, no pallor or diaphoresis Eyes:  pupils equal and round, sclera anicteric without injection Heart :RRR, no murmurs, no bruits, no LE edema Lungs:  clear to auscultation, no accessory muscle use Psych: well oriented with normal range of affect and appropriate judgment/insight  Essential hypertension - Plan: losartan-hydrochlorothiazide (HYZAAR) 100-25 MG tablet, amLODipine (NORVASC) 5 MG tablet  Sea sickness, initial encounter - Plan: scopolamine (TRANSDERM-SCOP) 1 MG/3DAYS  Status: uncontrolled Orders as above. Given the issue  with valsartan, will change to Hyzaar. We will add back amlodipine given her blood pressure today. Recommended she get a home blood pressure monitor. DASH diet information given. She is going on a cruise and is requesting something for seasickness. Counseled on diet and exercise F/u in 6 mo. The patient voiced understanding and agreement to the plan.  [place], DO 12/03/16  9:05 AM

## 2017-01-01 ENCOUNTER — Encounter: Payer: Self-pay | Admitting: Family Medicine

## 2017-01-01 ENCOUNTER — Ambulatory Visit (INDEPENDENT_AMBULATORY_CARE_PROVIDER_SITE_OTHER): Payer: 59 | Admitting: Family Medicine

## 2017-01-01 VITALS — BP 132/92 | HR 74 | Temp 98.4°F | Ht 64.0 in | Wt 205.0 lb

## 2017-01-01 DIAGNOSIS — K5900 Constipation, unspecified: Secondary | ICD-10-CM | POA: Diagnosis not present

## 2017-01-01 DIAGNOSIS — I10 Essential (primary) hypertension: Secondary | ICD-10-CM | POA: Diagnosis not present

## 2017-01-01 MED ORDER — LOSARTAN POTASSIUM-HCTZ 100-25 MG PO TABS: 1.0000 | ORAL_TABLET | Freq: Every day | ORAL | 1 refills | Status: DC

## 2017-01-01 MED ORDER — AMLODIPINE BESYLATE 5 MG PO TABS: 5.0000 mg | ORAL_TABLET | Freq: Every day | ORAL | 1 refills | Status: DC

## 2017-01-01 MED FILL — LOSARTAN-HCTZ 100-25 MG TAB: 100-25 | 30 days supply | Qty: 30 | Fill #0

## 2017-01-01 MED FILL — AMLODIPINE BESYLATE 5 MG TA: 5 | 30 days supply | Qty: 30 | Fill #0

## 2017-01-01 NOTE — Progress Notes (Signed)
Chief Complaint  Patient presents with  . Hypertension    Subjective Kaitlyn Stevens is a 41 y.o. female who presents for hypertension follow up. She does not monitor home blood pressures. She is usually compliant with medications- Hyzaar 100-25 mg daily, Norvasc 5 mg daily. Patient has these side effects of medication: none She is adhering to a healthy diet overall- she did just return from a cruise and had forgotten her medication. Current exercise: Walking   She just got back from a cruise and is constipated. Her last BM was 5 days ago. She has tried Miralax and Dulcolax without relief. She gets diffuse abd pain intermittently, worse at night. No bleeding, N/V/D, fevers.   Past Medical History:  Diagnosis Date  . Essential hypertension   . History of blood transfusion 1996   with c/s done in [place]  . SVD (spontaneous vaginal delivery) 1994   twins   Family History  Problem Relation Age of Onset  . Diabetes Mother   . Hypertension Mother   . Congenital heart disease Paternal Grandmother      Medications Current Outpatient Prescriptions on File Prior to Visit  Medication Sig Dispense Refill  . amLODipine (NORVASC) 5 MG tablet Take 1 tablet (5 mg total) by mouth daily. 30 tablet 2  . losartan-hydrochlorothiazide (HYZAAR) 100-25 MG tablet Take 1 tablet by mouth daily. 30 tablet 2   Allergies No Known Allergies  Review of Systems Cardiovascular: no chest pain Respiratory:  no shortness of breath  Exam BP (!) 132/92 (BP Location: Left Arm, Patient Position: Sitting, Cuff Size: Large)   Pulse 74   Temp 98.4 F (36.9 C) (Oral)   Ht 5' 4"  (1.626 m)   Wt 205 lb (93 kg)   SpO2 98%   BMI 35.19 kg/m  General:  well developed, well nourished, in no apparent distress Skin:  warm, no pallor or diaphoresis Eyes:  pupils equal and round, sclera anicteric without injection Heart :RRR, no LE edema Lungs:  clear to auscultation, no accessory muscle use Abd: Soft, ND, TTP  diffusely, no masses or organomegaly Psych: well oriented with normal range of affect and appropriate judgment/insight  Essential hypertension - Plan: losartan-hydrochlorothiazide (HYZAAR) 100-25 MG tablet, amLODipine (NORVASC) 5 MG tablet  Constipation, unspecified constipation type  Orders as above. She continues to improve. Cont current medication. Will really hamper down on lifestyle changes, she has DASH diet but has not started it due to recent vacation. Enema, continue stool softeners and adequate hydration. F/u in 1 mo. The patient voiced understanding and agreement to the plan.  [place], DO 01/01/17  7:33 AM

## 2017-01-01 NOTE — Progress Notes (Signed)
Pre visit review using our clinic review tool, if applicable. No additional management support is needed unless otherwise documented below in the visit note. 

## 2017-01-01 NOTE — Patient Instructions (Signed)
Keep on with the DASH diet.  Aim to do some physical exertion for 150 minutes per week. This is typically divided into 5 days per week, 30 minutes per day. The activity should be enough to get your heart rate up. Anything is better than nothing if you have time constraints.  You are very close. I want you lower than 140 on the top and lower than 90 on the bottom. Keep up the great work.   Try an enema to help with your constipation issue.

## 2017-02-11 ENCOUNTER — Ambulatory Visit (INDEPENDENT_AMBULATORY_CARE_PROVIDER_SITE_OTHER): Payer: 59 | Admitting: Pulmonary Disease

## 2017-02-11 ENCOUNTER — Encounter: Payer: Self-pay | Admitting: Pulmonary Disease

## 2017-02-11 VITALS — BP 138/84 | HR 76 | Ht 64.0 in | Wt 206.2 lb

## 2017-02-11 DIAGNOSIS — R29818 Other symptoms and signs involving the nervous system: Secondary | ICD-10-CM | POA: Diagnosis not present

## 2017-02-11 NOTE — Progress Notes (Signed)
   Subjective:    Patient ID: Kaitlyn Stevens, female    DOB: 08-19-1975, 41 y.o.   MRN: [id number]  HPI    Review of Systems  Constitutional: Negative for fever and unexpected weight change.  HENT: Negative for congestion, dental problem, ear pain, nosebleeds, postnasal drip, rhinorrhea, sinus pressure, sneezing, sore throat and trouble swallowing.   Eyes: Negative for redness and itching.  Respiratory: Negative for cough, chest tightness, shortness of breath and wheezing.   Cardiovascular: Negative for palpitations and leg swelling.  Gastrointestinal: Negative for nausea and vomiting.  Genitourinary: Negative for dysuria.  Musculoskeletal: Negative for joint swelling.  Skin: Negative for rash.  Allergic/Immunologic: Negative.  Negative for environmental allergies, food allergies and immunocompromised state.  Neurological: Negative for headaches.  Hematological: Does not bruise/bleed easily.  Psychiatric/Behavioral: Negative for dysphoric mood. The patient is not nervous/anxious.        Objective:   Physical Exam        Assessment & Plan:

## 2017-02-11 NOTE — Progress Notes (Signed)
Past Surgical History She  has a past surgical history that includes Cesarean section (1996); Tubal ligation; and polypectomy (02/13/2012).  No Known Allergies  Family History Her family history includes Congenital heart disease in her paternal grandmother; Diabetes in her mother; Hypertension in her mother.  Social History She  reports that she has never smoked. She has never used smokeless tobacco. She reports that she does not drink alcohol or use drugs.  Review of systems Constitutional: Negative for fever and unexpected weight change.  HENT: Negative for congestion, dental problem, ear pain, nosebleeds, postnasal drip, rhinorrhea, sinus pressure, sneezing, sore throat and trouble swallowing.   Eyes: Negative for redness and itching.  Respiratory: Negative for cough, chest tightness, shortness of breath and wheezing.   Cardiovascular: Negative for palpitations and leg swelling.  Gastrointestinal: Negative for nausea and vomiting.  Genitourinary: Negative for dysuria.  Musculoskeletal: Negative for joint swelling.  Skin: Negative for rash.  Allergic/Immunologic: Negative.  Negative for environmental allergies, food allergies and immunocompromised state.  Neurological: Negative for headaches.  Hematological: Does not bruise/bleed easily.  Psychiatric/Behavioral: Negative for dysphoric mood. The patient is not nervous/anxious.     Current Outpatient Prescriptions on File Prior to Visit  Medication Sig  . amLODipine (NORVASC) 5 MG tablet Take 1 tablet (5 mg total) by mouth daily.  Marland Kitchen losartan-hydrochlorothiazide (HYZAAR) 100-25 MG tablet Take 1 tablet by mouth daily.   No current facility-administered medications on file prior to visit.     Chief Complaint  Patient presents with  . sleep consult    Pt referred Dr. Lita Mains MD. Pt snores loudly at night, sleeps well but having sleepiness during the day.     Cardiac tests Echo 10/20/13 >> EF 55 to 60%  Past medical  history She  has a past medical history of Essential hypertension; History of blood transfusion (1996); and SVD (spontaneous vaginal delivery) (1994).  Vital signs BP 138/84 (BP Location: Left Arm, Cuff Size: Normal)   Pulse 76   Ht 5' 4"  (1.626 m)   Wt 206 lb 3.2 oz (93.5 kg)   SpO2 96%   BMI 35.39 kg/m   History of Present Illness Kaitlyn Stevens is a 41 y.o. female for evaluation of sleep problems.  She was seen recently by her PCP.  She is on several medications for her blood pressure.  She has been told she snores.  There was concern she could have sleep apnea contributing to her refractory hypertension.  She tends to wake up more when sleeping on her back, and has frequent dreams. She sips water at night to keep her mouth moist after she wakes up.  She will fall asleep in the evening if she sits on the couch after working.  Her breathing can get shallow at night sometimes.  She goes to sleep at 10 pm.  She falls asleep quickly.  She wakes up 2 or 3 times to use the bathroom.  She gets out of bed at 615 am.  She feels okay in the morning.  She denies morning headache.  She does not use anything to help her fall sleep or stay awake.  She denies sleep walking, sleep talking, bruxism, or nightmares.  There is no history of restless legs.  She denies sleep hallucinations, sleep paralysis, or cataplexy.  The Epworth score is 3 out of 24.   Physical Exam:  General - No distress Eyes - pupils reactive ENT - No sinus tenderness, no oral exudate, no LAN, no thyromegaly, TM clear,  pupils equal/reactive, MP 3 Cardiac - s1s2 regular, no murmur, pulses symmetric Chest - No wheeze/rales/dullness, good air entry, normal respiratory excursion Back - No focal tenderness Abd - Soft, non-tender, no organomegaly, + bowel sounds Ext - No edema Neuro - Normal strength, cranial nerves intact Skin - No rashes Psych - Normal mood, and behavior  Discussion: She has snoring, sleep disruption,  apnea, and daytime sleepiness.  She has history of refractory hypertension requiring several medications.  Her BMI is greater than 35.  I am concerned she could have sleep apnea.  We discussed how sleep apnea can affect various health problems, including risks for hypertension, cardiovascular disease, and diabetes.  We also discussed how sleep disruption can increase risks for accidents, such as while driving.  Weight loss as a means of improving sleep apnea was also reviewed.  Additional treatment options discussed were CPAP therapy, oral appliance, and surgical intervention.  Assessment/plan:  Suspected sleep apnea. - will arrange for home sleep study   Patient Instructions  Will arrange for home sleep study Will call to arrange for follow up after sleep study reviewed    Chesley Mires, M.D. Pager [phone] 02/11/2017, 10:58 AM

## 2017-02-11 NOTE — Patient Instructions (Signed)
Will arrange for home sleep study Will call to arrange for follow up after sleep study reviewed  

## 2017-03-05 DIAGNOSIS — G4733 Obstructive sleep apnea (adult) (pediatric): Secondary | ICD-10-CM | POA: Diagnosis not present

## 2017-03-12 ENCOUNTER — Telehealth: Payer: Self-pay | Admitting: Pulmonary Disease

## 2017-03-12 DIAGNOSIS — G4733 Obstructive sleep apnea (adult) (pediatric): Secondary | ICD-10-CM | POA: Diagnosis not present

## 2017-03-12 NOTE — Telephone Encounter (Signed)
HST 03/05/17 >> AHI 12.2, SaO2 low 80%   Will have my nurse inform pt that sleep study shows mild sleep apnea.  Options are 1) CPAP now, 2) ROV first.  If She is agreeable to CPAP, then please send order for auto CPAP range 5 to 15 cm H2O with heated humidity and mask of choice.  Have download sent 1 month after starting CPAP and set up ROV 2 months after starting CPAP.  ROV can be with me or NP.

## 2017-03-13 NOTE — Telephone Encounter (Signed)
Called and spoke with patient today regarding results per vs.  Informed the patient of his results today and recommendations per vs. The patient verbalized understanding and denied any questions or concerns at this time. Patient is wanting to think about this recommendation, not sure if she is wanting to have a machine on her face each night to sleep.   Patient will call back later next week regarding if she wants to order CPAP machine or not, or schedule ROV with VS or NP.

## 2017-03-19 ENCOUNTER — Other Ambulatory Visit: Payer: Self-pay | Admitting: *Deleted

## 2017-03-19 DIAGNOSIS — R29818 Other symptoms and signs involving the nervous system: Secondary | ICD-10-CM

## 2017-06-17 ENCOUNTER — Other Ambulatory Visit: Payer: Self-pay

## 2017-06-17 ENCOUNTER — Encounter (HOSPITAL_BASED_OUTPATIENT_CLINIC_OR_DEPARTMENT_OTHER): Payer: Self-pay

## 2017-06-17 ENCOUNTER — Emergency Department (HOSPITAL_BASED_OUTPATIENT_CLINIC_OR_DEPARTMENT_OTHER)
Admission: EM | Admit: 2017-06-17 | Discharge: 2017-06-18 | Disposition: A | Discharge status: Home / Self Care | Payer: 59 | Attending: Emergency Medicine | Admitting: Emergency Medicine

## 2017-06-17 DIAGNOSIS — K219 Gastro-esophageal reflux disease without esophagitis: Secondary | ICD-10-CM | POA: Diagnosis not present

## 2017-06-17 DIAGNOSIS — I10 Essential (primary) hypertension: Secondary | ICD-10-CM | POA: Insufficient documentation

## 2017-06-17 DIAGNOSIS — K5909 Other constipation: Secondary | ICD-10-CM

## 2017-06-17 DIAGNOSIS — K641 Second degree hemorrhoids: Secondary | ICD-10-CM | POA: Insufficient documentation

## 2017-06-17 DIAGNOSIS — R1013 Epigastric pain: Secondary | ICD-10-CM | POA: Diagnosis present

## 2017-06-17 DIAGNOSIS — K5900 Constipation, unspecified: Secondary | ICD-10-CM | POA: Diagnosis not present

## 2017-06-17 LAB — URINALYSIS, ROUTINE W REFLEX MICROSCOPIC
Bilirubin Urine: NEGATIVE
Glucose, UA: NEGATIVE mg/dL
HGB URINE DIPSTICK: NEGATIVE
Ketones, ur: NEGATIVE mg/dL
Leukocytes, UA: NEGATIVE
NITRITE: NEGATIVE
PH: 6.5 (ref 5.0–8.0)
Protein, ur: NEGATIVE mg/dL
SPECIFIC GRAVITY, URINE: 1.015 (ref 1.005–1.030)

## 2017-06-17 LAB — PREGNANCY, URINE: Preg Test, Ur: NEGATIVE

## 2017-06-17 MED ORDER — GI COCKTAIL ~~LOC~~
30.0000 mL | Freq: Once | ORAL | Status: AC
  Administered 2017-06-17: 30 mL via ORAL
  Filled 2017-06-17: qty 30

## 2017-06-17 MED ORDER — DICYCLOMINE HCL 10 MG PO CAPS
10.0000 mg | ORAL_CAPSULE | Freq: Once | ORAL | Status: AC
  Administered 2017-06-17: 10 mg via ORAL
  Filled 2017-06-17: qty 1

## 2017-06-17 NOTE — ED Triage Notes (Signed)
C/o abd pain x months-denies current n/v/d, vaginal d/c-c/o constipation-pos blood in stool twice-NAD-steady gait

## 2017-06-18 ENCOUNTER — Emergency Department (HOSPITAL_BASED_OUTPATIENT_CLINIC_OR_DEPARTMENT_OTHER): Payer: 59

## 2017-06-18 ENCOUNTER — Encounter (HOSPITAL_BASED_OUTPATIENT_CLINIC_OR_DEPARTMENT_OTHER): Payer: Self-pay | Admitting: Emergency Medicine

## 2017-06-18 MED ORDER — OMEPRAZOLE 20 MG PO CPDR: 20.0000 mg | DELAYED_RELEASE_CAPSULE | Freq: Every day | ORAL | 0 refills | Status: DC

## 2017-06-18 MED ORDER — POLYETHYLENE GLYCOL 3350 17 GM/SCOOP PO POWD: 17.0000 g | Freq: Every day | ORAL | 0 refills | Status: DC

## 2017-06-18 MED FILL — OMEPRAZOLE 20 MG CAP: 20 | 30 days supply | Qty: 30 | Fill #0

## 2017-06-18 MED FILL — POLYETHYLENE GLYCOL 3350 PO: 14 days supply | Qty: 238 | Fill #0

## 2017-06-18 NOTE — ED Notes (Signed)
ED Provider at bedside. 

## 2017-06-18 NOTE — ED Provider Notes (Signed)
[facility] EMERGENCY DEPARTMENT Provider Note   CSN: [id number] Arrival date & time: 06/17/17  2056     History   Chief Complaint Chief Complaint  Patient presents with  . Abdominal Pain    HPI Kaitlyn Stevens is a 42 y.o. female.  The history is provided by the patient.  Abdominal Pain   This is a chronic problem. The current episode started more than 1 week ago (months, worse at night.  ). The problem occurs daily. The problem has not changed since onset.The pain is associated with eating. The pain is located in the epigastric region. The quality of the pain is cramping and burning. The pain is at a severity of 6/10. The pain is moderate. Associated symptoms include constipation. Pertinent negatives include anorexia, fever, belching, diarrhea, melena, nausea and dysuria. Associated symptoms comments: Strains to have BM and then sees blood. Nothing aggravates the symptoms. Nothing relieves the symptoms. Past workup does not include GI consult. Her past medical history does not include PUD, gallstones or Crohn's disease.    Past Medical History:  Diagnosis Date  . Essential hypertension   . History of blood transfusion 1996   with c/s done in [place]  . SVD (spontaneous vaginal delivery) 1994   twins    Patient Active Problem List   Diagnosis Date Noted  . OSA (obstructive sleep apnea) 03/12/2017  . HTN (hypertension) 09/30/2013  . Murmur 09/30/2013  . SVD (spontaneous vaginal delivery)   . History of blood transfusion     Past Surgical History:  Procedure Laterality Date  . CESAREAN SECTION  1996   x 1 twins  . POLYPECTOMY  02/13/2012   Procedure: POLYPECTOMY;  Surgeon: Sharene Butters, MD;  Location: [facility];  Service: Gynecology;  Laterality: N/A;  . TUBAL LIGATION      OB History    No data available       Home Medications    Prior to Admission medications   Medication Sig Start Date End Date Taking? Authorizing Provider  omeprazole (PRILOSEC) 20 MG  capsule Take 1 capsule (20 mg total) by mouth daily. 06/18/17   Alexiah Koroma, MD  polyethylene glycol powder (MIRALAX) powder Take 17 g by mouth daily. 06/18/17   Carmelina Balducci, MD    Family History Family History  Problem Relation Age of Onset  . Diabetes Mother   . Hypertension Mother   . Congenital heart disease Paternal Grandmother     Social History Social History   Tobacco Use  . Smoking status: Never Smoker  . Smokeless tobacco: Never Used  Substance Use Topics  . Alcohol use: No  . Drug use: No     Allergies   Patient has no known allergies.   Review of Systems Review of Systems  Constitutional: Negative for fever.  Respiratory: Negative for shortness of breath.   Cardiovascular: Negative for chest pain, palpitations and leg swelling.  Gastrointestinal: Positive for abdominal pain and constipation. Negative for anorexia, blood in stool, diarrhea, melena and nausea.  Genitourinary: Negative for dysuria.  All other systems reviewed and are negative.    Physical Exam Updated Vital Signs BP (!) 188/110 (BP Location: Right Arm)   Pulse 90   Temp 98.7 F (37.1 C) (Oral)   Resp 16   Ht 5' 4"  (1.626 m)   Wt 92.1 kg (203 lb)   LMP  (LMP Unknown)   SpO2 99%   BMI 34.84 kg/m   Physical Exam  Constitutional: She is oriented to  person, place, and time. She appears well-developed and well-nourished. No distress.  HENT:  Head: Normocephalic and atraumatic.  Mouth/Throat: Oropharynx is clear and moist. No oropharyngeal exudate.  Eyes: Conjunctivae are normal. Pupils are equal, round, and reactive to light.  Neck: Normal range of motion. Neck supple.  Cardiovascular: Normal rate, regular rhythm, normal heart sounds and intact distal pulses.  Pulmonary/Chest: Effort normal and breath sounds normal. No stridor. She has no wheezes. She has no rales.  Abdominal: Soft. She exhibits no mass. There is no hepatosplenomegaly. There is no tenderness. There is no rebound, no  guarding, no tenderness at McBurney's point and negative Murphy's sign.  Gassy in the upper abdomen  Genitourinary:  Genitourinary Comments: Chaperone present patient has 2 hemorrhoids non thrombosed.    Musculoskeletal: Normal range of motion.  Neurological: She is alert and oriented to person, place, and time.  Skin: Skin is warm and dry. Capillary refill takes less than 2 seconds.  Psychiatric: She has a normal mood and affect.     ED Treatments / Results  Labs (all labs ordered are listed, but only abnormal results are displayed)  Results for orders placed or performed during the hospital encounter of 06/17/17  Urinalysis, Routine w reflex microscopic  Result Value Ref Range   Color, Urine YELLOW YELLOW   APPearance CLEAR CLEAR   Specific Gravity, Urine 1.015 1.005 - 1.030   pH 6.5 5.0 - 8.0   Glucose, UA NEGATIVE NEGATIVE mg/dL   Hgb urine dipstick NEGATIVE NEGATIVE   Bilirubin Urine NEGATIVE NEGATIVE   Ketones, ur NEGATIVE NEGATIVE mg/dL   Protein, ur NEGATIVE NEGATIVE mg/dL   Nitrite NEGATIVE NEGATIVE   Leukocytes, UA NEGATIVE NEGATIVE  Pregnancy, urine  Result Value Ref Range   Preg Test, Ur NEGATIVE NEGATIVE   Dg Abdomen Acute W/chest  Result Date: 06/18/2017 CLINICAL DATA:  Chronic epigastric abdominal pain and bloating. Constipation. Hematochezia. EXAM: DG ABDOMEN ACUTE W/ 1V CHEST COMPARISON:  None. FINDINGS: The lungs are well-aerated and clear. There is no evidence of focal opacification, pleural effusion or pneumothorax. The cardiomediastinal silhouette is within normal limits. The visualized bowel gas pattern is unremarkable. Scattered stool and air are seen within the colon; there is no evidence of small bowel dilatation to suggest obstruction. No free intra-abdominal air is identified on the provided upright view. No acute osseous abnormalities are seen; the sacroiliac joints are unremarkable in appearance. IMPRESSION: 1. Unremarkable bowel gas pattern; no free  intra-abdominal air seen. Moderate amount of stool noted in the colon. 2. No acute cardiopulmonary process seen. Electronically Signed   By: Garald Balding M.D.   On: 06/18/2017 00:48    Radiology Dg Abdomen Acute W/chest  Result Date: 06/18/2017 CLINICAL DATA:  Chronic epigastric abdominal pain and bloating. Constipation. Hematochezia. EXAM: DG ABDOMEN ACUTE W/ 1V CHEST COMPARISON:  None. FINDINGS: The lungs are well-aerated and clear. There is no evidence of focal opacification, pleural effusion or pneumothorax. The cardiomediastinal silhouette is within normal limits. The visualized bowel gas pattern is unremarkable. Scattered stool and air are seen within the colon; there is no evidence of small bowel dilatation to suggest obstruction. No free intra-abdominal air is identified on the provided upright view. No acute osseous abnormalities are seen; the sacroiliac joints are unremarkable in appearance. IMPRESSION: 1. Unremarkable bowel gas pattern; no free intra-abdominal air seen. Moderate amount of stool noted in the colon. 2. No acute cardiopulmonary process seen. Electronically Signed   By: Garald Balding M.D.   On: 06/18/2017 00:48  Procedures Procedures (including critical care time)  Medications Ordered in ED Medications  gi cocktail (Maalox,Lidocaine,Donnatal) (30 mLs Oral Given 06/17/17 2355)  dicyclomine (BENTYL) capsule 10 mg (10 mg Oral Given 06/17/17 2355)      Final Clinical Impressions(s) / ED Diagnoses   Final diagnoses:  Gastroesophageal reflux disease without esophagitis  Other constipation  Grade II hemorrhoids   Better post medication, symptoms mainly at night consistent with gerd.  GERD friendly diet, no food or drink 2 hours prior to bed and prilosec.  Start miralax therapy daily.  Follow up with your PMD.    Return for weakness, numbness, changes in vision or speech,  fevers > 100.4 unrelieved by medication, shortness of breath, intractable vomiting, or diarrhea,  abdominal pain, Inability to tolerate liquids or food, cough, altered mental status or any concerns. No signs of systemic illness or infection. The patient is nontoxic-appearing on exam and vital signs are within normal limits.    I have reviewed the triage vital signs and the nursing notes. Pertinent labs &imaging results that were available during my care of the patient were reviewed by me and considered in my medical decision making (see chart for details).  After history, exam, and medical workup I feel the patient has been appropriately medically screened and is safe for discharge home. Pertinent diagnoses were discussed with the patient. Patient was given return precautions.  ED Discharge Orders        Ordered    omeprazole (PRILOSEC) 20 MG capsule  Daily     06/18/17 0047    polyethylene glycol powder (MIRALAX) powder  Daily     06/18/17 0047       Leveda Kendrix, MD 06/18/17 [id number]

## 2017-06-19 MED FILL — AMLODIPINE BESYLATE 5 MG TA: 5 | 30 days supply | Qty: 30 | Fill #1

## 2017-06-19 MED FILL — LOSARTAN-HCTZ 100-25 MG TAB: 100-25 | 30 days supply | Qty: 30 | Fill #1

## 2017-07-13 ENCOUNTER — Encounter: Payer: Self-pay | Admitting: Family Medicine

## 2017-07-13 ENCOUNTER — Encounter: Payer: Self-pay | Admitting: Physician Assistant

## 2017-07-13 ENCOUNTER — Ambulatory Visit (INDEPENDENT_AMBULATORY_CARE_PROVIDER_SITE_OTHER): Payer: 59 | Admitting: Family Medicine

## 2017-07-13 VITALS — BP 140/90 | HR 91 | Temp 97.9°F | Ht 64.0 in | Wt 195.5 lb

## 2017-07-13 DIAGNOSIS — R1013 Epigastric pain: Secondary | ICD-10-CM

## 2017-07-13 DIAGNOSIS — I10 Essential (primary) hypertension: Secondary | ICD-10-CM | POA: Diagnosis not present

## 2017-07-13 MED ORDER — LOSARTAN POTASSIUM-HCTZ 100-25 MG PO TABS: 1.0000 | ORAL_TABLET | Freq: Every day | ORAL | 1 refills | Status: DC

## 2017-07-13 MED ORDER — DICYCLOMINE HCL 20 MG PO TABS: 20.0000 mg | ORAL_TABLET | Freq: Three times a day (TID) | ORAL | 0 refills | Status: DC

## 2017-07-13 MED ORDER — AMLODIPINE BESYLATE 5 MG PO TABS: 5.0000 mg | ORAL_TABLET | Freq: Every day | ORAL | 1 refills | Status: DC

## 2017-07-13 MED FILL — AMLODIPINE BESYLATE 5 MG TA: 5 | 30 days supply | Qty: 30 | Fill #0

## 2017-07-13 MED FILL — DICYCLOMINE 20 MG TABLET: 20 | 8 days supply | Qty: 30 | Fill #0

## 2017-07-13 MED FILL — LOSARTAN-HCTZ 100-25 MG TAB: 100-25 | 30 days supply | Qty: 30 | Fill #0

## 2017-07-13 NOTE — Progress Notes (Signed)
Chief Complaint  Patient presents with  . GI Problem    Kaitlyn Stevens is here for crampy epigastric abdominal pain.  Duration: 5 weeks, worse at night Nighttime awakenings? Yes  Bleeding? Yes; hemorrhoid Weight loss? Yes - 5 lbs Palliation: None Provocation: Unknown Associated symptoms: early satiety Denies: fever, nausea and vomiting Treatment to date: PPI, Miralax, Pedialyte  The patient has not been checking her blood pressure at home and reports not being compliant with medicine.  ROS: Constitutional: No fevers GI: No N/V/D, no bleeding + pain  Past Medical History:  Diagnosis Date  . Essential hypertension   . History of blood transfusion 1996   with c/s done in [place]  . SVD (spontaneous vaginal delivery) 1994   twins   Family History  Problem Relation Age of Onset  . Diabetes Mother   . Hypertension Mother   . Congenital heart disease Paternal Grandmother    Past Surgical History:  Procedure Laterality Date  . CESAREAN SECTION  1996   x 1 twins  . POLYPECTOMY  02/13/2012   Procedure: POLYPECTOMY;  Surgeon: Sharene Butters, MD;  Location: [facility];  Service: Gynecology;  Laterality: N/A;  . TUBAL LIGATION      BP 140/90 (BP Location: Left Arm, Patient Position: Sitting, Cuff Size: Large)   Pulse 91   Temp 97.9 F (36.6 C) (Oral)   Ht 5' 4"  (1.626 m)   Wt 195 lb 8 oz (88.7 kg)   LMP  (LMP Unknown)   SpO2 95%   BMI 33.56 kg/m  Gen.: Awake, alert, appears stated age 42: Mucous membranes moist without mucosal lesions Heart: Regular rate and rhythm without murmurs Lungs: Clear auscultation bilaterally, no rales or wheezing, normal effort without accessory muscle use. Abdomen: Bowel sounds are present. Abdomen is soft, ttp in epigastric region, nondistended, no masses or organomegaly. +Murphy's, Neg Rovsing's, McBurney's, and Carnett's sign. Psych: Age appropriate judgment and insight. Normal mood and affect.  Epigastric abdominal pain - Plan: US Abdomen  Limited RUQ, dicyclomine (BENTYL) 20 MG tablet  Essential hypertension - Plan: amLODipine (NORVASC) 5 MG tablet, losartan-hydrochlorothiazide (HYZAAR) 100-25 MG tablet  Orders as above. Given the crampy pain, will use Bentyl.  Continue PPI.  MiraLAX 1-2 times daily for the next 3 days, if no improvement will he need to try an enema.  As an appointment with a gastroenterologist in 8 days.  I encouraged her to keep this appointment. BP still elevated. She has not been compliant.  We will reorder medicine. F/u in 6 weeks to recheck blood pressure. Pt voiced understanding and agreement to the plan.  [place], DO 07/13/17 3:37 PM

## 2017-07-13 NOTE — Progress Notes (Signed)
Pre visit review using our clinic review tool, if applicable. No additional management support is needed unless otherwise documented below in the visit note. 

## 2017-07-13 NOTE — Patient Instructions (Signed)
Try to keep an eye on which foods affect your pain.    Try Pepto-Bismol as this can help coat your stomach.   Try MiraLAX 1-2 times daily over the next 3-4 days. If no improvement, try using an enema. Stay well hydrated and keep lots of fiber in your diet.  Let us know if you need anything.

## 2017-07-15 ENCOUNTER — Encounter (HOSPITAL_BASED_OUTPATIENT_CLINIC_OR_DEPARTMENT_OTHER): Payer: Self-pay

## 2017-07-15 ENCOUNTER — Observation Stay (HOSPITAL_BASED_OUTPATIENT_CLINIC_OR_DEPARTMENT_OTHER)
Admission: EM | Admit: 2017-07-15 | Discharge: 2017-07-17 | Disposition: A | Discharge status: Home / Self Care | Payer: 59 | Attending: General Surgery | Admitting: General Surgery

## 2017-07-15 ENCOUNTER — Telehealth: Payer: Self-pay | Admitting: Family Medicine

## 2017-07-15 ENCOUNTER — Other Ambulatory Visit: Payer: Self-pay | Admitting: Family Medicine

## 2017-07-15 ENCOUNTER — Other Ambulatory Visit: Payer: Self-pay

## 2017-07-15 ENCOUNTER — Ambulatory Visit (HOSPITAL_BASED_OUTPATIENT_CLINIC_OR_DEPARTMENT_OTHER)
Admission: RE | Admit: 2017-07-15 | Discharge: 2017-07-15 | Disposition: A | Discharge status: Home / Self Care | Payer: 59 | Source: Ambulatory Visit | Attending: Family Medicine | Admitting: Family Medicine

## 2017-07-15 DIAGNOSIS — I10 Essential (primary) hypertension: Secondary | ICD-10-CM | POA: Diagnosis not present

## 2017-07-15 DIAGNOSIS — K8000 Calculus of gallbladder with acute cholecystitis without obstruction: Secondary | ICD-10-CM | POA: Diagnosis not present

## 2017-07-15 DIAGNOSIS — G4733 Obstructive sleep apnea (adult) (pediatric): Secondary | ICD-10-CM | POA: Insufficient documentation

## 2017-07-15 DIAGNOSIS — R1013 Epigastric pain: Secondary | ICD-10-CM

## 2017-07-15 DIAGNOSIS — K819 Cholecystitis, unspecified: Secondary | ICD-10-CM

## 2017-07-15 DIAGNOSIS — Z79899 Other long term (current) drug therapy: Secondary | ICD-10-CM | POA: Insufficient documentation

## 2017-07-15 LAB — CBC WITH DIFFERENTIAL/PLATELET
BASOS ABS: 0 10*3/uL (ref 0.0–0.1)
Basophils Relative: 0 %
EOS ABS: 0.1 10*3/uL (ref 0.0–0.7)
Eosinophils Relative: 1 %
HCT: 39.5 % (ref 36.0–46.0)
Hemoglobin: 13.9 g/dL (ref 12.0–15.0)
Lymphocytes Relative: 22 %
Lymphs Abs: 2.2 10*3/uL (ref 0.7–4.0)
MCH: 29.9 pg (ref 26.0–34.0)
MCHC: 35.2 g/dL (ref 30.0–36.0)
MCV: 84.9 fL (ref 78.0–100.0)
MONO ABS: 0.8 10*3/uL (ref 0.1–1.0)
Monocytes Relative: 9 %
NEUTROS ABS: 6.7 10*3/uL (ref 1.7–7.7)
Neutrophils Relative %: 68 %
PLATELETS: 397 10*3/uL (ref 150–400)
RBC: 4.65 MIL/uL (ref 3.87–5.11)
RDW: 13.1 % (ref 11.5–15.5)
WBC: 9.8 10*3/uL (ref 4.0–10.5)

## 2017-07-15 LAB — COMPREHENSIVE METABOLIC PANEL
ALT: 16 U/L (ref 14–54)
AST: 22 U/L (ref 15–41)
Albumin: 4.7 g/dL (ref 3.5–5.0)
Alkaline Phosphatase: 64 U/L (ref 38–126)
Anion gap: 11 (ref 5–15)
BUN: 11 mg/dL (ref 6–20)
CHLORIDE: 98 mmol/L — AB (ref 101–111)
CO2: 28 mmol/L (ref 22–32)
Calcium: 9.1 mg/dL (ref 8.9–10.3)
Creatinine, Ser: 1.05 mg/dL — ABNORMAL HIGH (ref 0.44–1.00)
GFR calc Af Amer: >60 mL/min (ref >60)
GFR calc non Af Amer: >60 mL/min (ref >60)
GLUCOSE: 128 mg/dL — AB (ref 65–99)
POTASSIUM: 3.2 mmol/L — AB (ref 3.5–5.1)
Sodium: 137 mmol/L (ref 135–145)
Total Bilirubin: 0.5 mg/dL (ref 0.3–1.2)
Total Protein: 9.4 g/dL — ABNORMAL HIGH (ref 6.5–8.1)

## 2017-07-15 LAB — LIPASE, BLOOD: LIPASE: 25 U/L (ref 11–51)

## 2017-07-15 MED ORDER — ONDANSETRON HCL 4 MG/2ML IJ SOLN
4.0000 mg | Freq: Once | INTRAMUSCULAR | Status: AC
  Administered 2017-07-15: 4 mg via INTRAVENOUS
  Filled 2017-07-15: qty 2

## 2017-07-15 MED ORDER — SODIUM CHLORIDE 0.9 % IV BOLUS (SEPSIS)
1000.0000 mL | Freq: Once | INTRAVENOUS | Status: AC
  Administered 2017-07-15: 1000 mL via INTRAVENOUS

## 2017-07-15 MED ORDER — PIPERACILLIN-TAZOBACTAM 3.375 G IVPB 30 MIN
3.3750 g | Freq: Once | INTRAVENOUS | Status: AC
  Administered 2017-07-15: 3.375 g via INTRAVENOUS
  Filled 2017-07-15 (×2): qty 50

## 2017-07-15 MED ORDER — TRAMADOL HCL 50 MG PO TABS: 50.0000 mg | ORAL_TABLET | Freq: Two times a day (BID) | ORAL | 0 refills | Status: DC | PRN

## 2017-07-15 MED ORDER — PIPERACILLIN-TAZOBACTAM 3.375 G IVPB: 3.3750 g | Freq: Three times a day (TID) | INTRAVENOUS | Status: DC

## 2017-07-15 MED ORDER — HYDROMORPHONE HCL 1 MG/ML IJ SOLN
1.0000 mg | Freq: Once | INTRAMUSCULAR | Status: AC
  Administered 2017-07-15: 1 mg via INTRAVENOUS
  Filled 2017-07-15: qty 1

## 2017-07-15 MED ORDER — MORPHINE SULFATE (PF) 4 MG/ML IV SOLN
4.0000 mg | Freq: Once | INTRAVENOUS | Status: AC
  Administered 2017-07-15: 4 mg via INTRAVENOUS
  Filled 2017-07-15: qty 1

## 2017-07-15 NOTE — Telephone Encounter (Signed)
Called in Tramadol. If she is staying well hydrated, I think adding a fiber supplement like Metamucil could be helpful. TY.

## 2017-07-15 NOTE — ED Triage Notes (Signed)
Pt states she was set from out pt [place] for "inflammed GB and stones"-pt pacing-abd pain x weeks-worse x 6 days

## 2017-07-15 NOTE — Telephone Encounter (Signed)
Ivin Booty from ultrasound states that the pt is currently still in the  radiology department with complaints of having a lot of pain. Ivin Booty states that results show that the pt has acute cholecystitis. Informed Ivin Booty to advise pt to seek treatment in the ED due to current complaints of pain per previous notes by Earney Mallet. Ivin Booty states she will notify the pt to go to the ED.

## 2017-07-15 NOTE — H&P (Addendum)
Chief Complaint:  Right upper quadrant pain and gallstones  History of Present Illness:  Kaitlyn Stevens is an 42 y.o. female who was evaluated at [facility] for 3 days of worsening right upper quadrant pain.  The pain was difficult to control and her ultrasound suggested acute uncomplicated cholecystitis.  Her LFTs and her lipase were normal.    She has had C sections in the past;  No upper abdominal surgery noted.  She denies allergy.  She has treated hypertension.    Past Medical History:  Diagnosis Date  . Essential hypertension   . History of blood transfusion 1996   with c/s done in [place]  . SVD (spontaneous vaginal delivery) 1994   twins    Past Surgical History:  Procedure Laterality Date  . CESAREAN SECTION  1996   x 1 twins  . POLYPECTOMY  02/13/2012   Procedure: POLYPECTOMY;  Surgeon: Sharene Butters, MD;  Location: [facility];  Service: Gynecology;  Laterality: N/A;  . TUBAL LIGATION      Current Facility-Administered Medications  Medication Dose Route Frequency Provider Last Rate Last Dose  . [START ON 07/16/2017] piperacillin-tazobactam (ZOSYN) IVPB 3.375 g  3.375 g Intravenous Q8H Mancheril, Darnell Level, [facility]       Current Outpatient Medications  Medication Sig Dispense Refill  . amLODipine (NORVASC) 5 MG tablet Take 1 tablet (5 mg total) by mouth daily. 90 tablet 1  . dicyclomine (BENTYL) 20 MG tablet Take 1 tablet (20 mg total) by mouth 4 (four) times daily -  before meals and at bedtime. 30 tablet 0  . losartan-hydrochlorothiazide (HYZAAR) 100-25 MG tablet Take 1 tablet by mouth daily. 90 tablet 1  . omeprazole (PRILOSEC) 20 MG capsule Take 1 capsule (20 mg total) by mouth daily. 30 capsule 0  . polyethylene glycol powder (MIRALAX) powder Take 17 g by mouth daily. 255 g 0  . traMADol (ULTRAM) 50 MG tablet Take 1 tablet (50 mg total) by mouth every 12 (twelve) hours as needed (Pain). 12 tablet 0   Patient has no known allergies. Family History  Problem Relation Age of  Onset  . Diabetes Mother   . Hypertension Mother   . Congenital heart disease Paternal Grandmother    Social History:   reports that  has never smoked. she has never used smokeless tobacco. She reports that she does not drink alcohol or use drugs.   REVIEW OF SYSTEMS : Negative neuro, CV, GI, allergies;  She is hypertensive  Physical Exam:   Blood pressure (!) 162/84, pulse 76, temperature 98 F (36.7 C), temperature source Oral, resp. rate 16, height _0  (1.626 m), weight 87.9 kg (193 lb 12.8 oz), SpO2 100 %. Body mass index is 33.27 kg/m.  Gen:  WDWN AAF NAD --medicated Neurological: Alert and oriented to person, place, and time. Motor and sensory function is grossly intact  Head: Normocephalic and atraumatic.  Eyes: Conjunctivae are normal. Pupils are equal, round, and reactive to light. No scleral icterus.  Neck: Normal range of motion. Neck supple. No tracheal deviation or thyromegaly present.  Cardiovascular:  SR without murmurs or gallops.  No carotid bruits Breast:  Not examined Respiratory: Effort normal.  No respiratory distress. No chest wall tenderness. Breath sounds normal.  No wheezes, rales or rhonchi.  Abdomen:  Tender in the right upper quadrant GU:  Not examined Musculoskeletal: Normal range of motion. Extremities are nontender. No cyanosis, edema or clubbing noted Lymphadenopathy: No cervical, preauricular, postauricular or axillary adenopathy is  present Skin: Skin is warm and dry. No rash noted. No diaphoresis. No erythema. No pallor. Pscyh: Normal mood and affect. Behavior is normal. Judgment and thought content normal.   LABORATORY RESULTS: Results for orders placed or performed during the hospital encounter of 07/15/17 (from the past 48 hour(s))  CBC with Differential     Status: None   Collection Time: 07/15/17  7:36 PM  Result Value Ref Range   WBC 9.8 4.0 - 10.5 K/uL   RBC 4.65 3.87 - 5.11 MIL/uL   Hemoglobin 13.9 12.0 - 15.0 g/dL   HCT 39.5 36.0 -  46.0 %   MCV 84.9 78.0 - 100.0 fL   MCH 29.9 26.0 - 34.0 pg   MCHC 35.2 30.0 - 36.0 g/dL   RDW 13.1 11.5 - 15.5 %   Platelets 397 150 - 400 K/uL   Neutrophils Relative % 68 %   Neutro Abs 6.7 1.7 - 7.7 K/uL   Lymphocytes Relative 22 %   Lymphs Abs 2.2 0.7 - 4.0 K/uL   Monocytes Relative 9 %   Monocytes Absolute 0.8 0.1 - 1.0 K/uL   Eosinophils Relative 1 %   Eosinophils Absolute 0.1 0.0 - 0.7 K/uL   Basophils Relative 0 %   Basophils Absolute 0.0 0.0 - 0.1 K/uL    Comment: Performed at [facility], [address] [place], [place] [address]  Comprehensive metabolic panel     Status: Abnormal   Collection Time: 07/15/17  7:36 PM  Result Value Ref Range   Sodium 137 135 - 145 mmol/L   Potassium 3.2 (L) 3.5 - 5.1 mmol/L   Chloride 98 (L) 101 - 111 mmol/L   CO2 28 22 - 32 mmol/L   Glucose, Bld 128 (H) 65 - 99 mg/dL   BUN 11 6 - 20 mg/dL   Creatinine, Ser 1.05 (H) 0.44 - 1.00 mg/dL   Calcium 9.1 8.9 - 10.3 mg/dL   Total Protein 9.4 (H) 6.5 - 8.1 g/dL   Albumin 4.7 3.5 - 5.0 g/dL   AST 22 15 - 41 U/L   ALT 16 14 - 54 U/L   Alkaline Phosphatase 64 38 - 126 U/L   Total Bilirubin 0.5 0.3 - 1.2 mg/dL   GFR calc non Af Amer >60 >60 mL/min   GFR calc Af Amer >60 >60 mL/min    Comment: (NOTE) The eGFR has been calculated using the CKD EPI equation. This calculation has not been validated in all clinical situations. eGFR's persistently <60 mL/min signify possible Chronic Kidney Disease.    Anion gap 11 5 - 15    Comment: Performed at [facility], [address] [place], [place] [address]  Lipase, blood     Status: None   Collection Time: 07/15/17  7:36 PM  Result Value Ref Range   Lipase 25 11 - 51 U/L    Comment: Performed at [facility], [address] [place], [place] [address]     RADIOLOGY RESULTS: US Abdomen Limited Ruq  Result Date: 07/15/2017 CLINICAL DATA:  Right upper quadrant pain for several weeks EXAM: ULTRASOUND ABDOMEN  LIMITED RIGHT UPPER QUADRANT COMPARISON:  None. FINDINGS: Gallbladder: Mild distended with multiple gallstones and gallbladder sludge within. One of the stones appears lodged within the neck of the gallbladder. Gallbladder wall is mildly prominent. There are changes suggestive of a mildly distended cystic duct measuring 9 mm. Minimal pericholecystic fluid is noted. Common bile duct: Diameter: 4 mm.  Liver: Focal area of increased echogenicity is noted in the left lobe of the liver measuring approximately 5.5 cm. This is suspicious for an underlying hemangioma. Portal vein is patent on color Doppler imaging with normal direction of blood flow towards the liver. IMPRESSION: Distended gallbladder with gallstones and gallbladder sludge as well as findings suggestive of a gallstone within the gallbladder neck and dilatation of the cystic duct. Some wall thickening and pericholecystic fluid is noted suspicious for acute cholecystitis. Focus of increased echogenicity within the left lobe of the liver suggestive of hemangioma. Follow-up ultrasound in 6 months is recommended to assess for stability. Electronically Signed   By: Inez Catalina M.D.   On: 07/15/2017 18:52    Problem List: Patient Active Problem List   Diagnosis Date Noted  . OSA (obstructive sleep apnea) 03/12/2017  . Essential hypertension 09/30/2013  . Murmur 09/30/2013  . SVD (spontaneous vaginal delivery)   . History of blood transfusion     Assessment & Plan: Cholecystitis Plan admit for lap chole tomorrow    Matt B. Hassell Done, MD, [facility] [phone] beeper [phone]  07/15/2017 11:15 PM

## 2017-07-15 NOTE — ED Notes (Signed)
Pt transferred from [facility] to see Dr Hassell Done

## 2017-07-15 NOTE — ED Notes (Signed)
ED Provider at bedside. 

## 2017-07-15 NOTE — Telephone Encounter (Signed)
Copied from [place] [id number]. Topic: Quick Communication - Rx Refill/Question >> Jul 15, 2017  5:25 PM Oliver Pila B wrote: Medication:  traMADol (ULTRAM) 50 MG tablet [[id number]]   Sent to wrong pharmacy the medication needs to be sent to [facility] and not [facility]

## 2017-07-15 NOTE — ED Provider Notes (Signed)
11:11 PM Patient was transferred from [facility] for evaluation by general surgery for possible gallbladder etiology of right-sided abdominal pain.  According to documentation, patient is already received fluids and antibiotics.    General surgery came to see the patient after arrival to [facility] and reports patient will be admitted for further management and likely surgery tomorrow.  He did not request any further medications or changes prior to admission.     Patient will be admitted for further management by surgery.       Denice Cardon, Gwenyth Allegra, MD 07/15/17 [phone]

## 2017-07-15 NOTE — Telephone Encounter (Signed)
Called imaging for her and changed her appt for 6 pm tonight, she has been informed. She is in a lot of pain and is requesting something asap to be sent in. Did explain PCP is seeing patients and will review messages after office visits done. I did encourage her to go to the ER asap due to the pain she is in. No one in the office/other providers had available appts. This afternoon.

## 2017-07-15 NOTE — ED Provider Notes (Signed)
[facility] Provider Note   CSN: [id number] Arrival date & time: 07/15/17  1905     History   Chief Complaint Chief Complaint  Patient presents with  . Abdominal Pain    HPI Kaitlyn Stevens is a 42 y.o. female.  HPI  Having episodes of pain since beginning of Feb. This episode of abdominal pain started Friday, improved then Sunday started again and has been constant since then.  Cramping pain at top of the stomach, severe worse than birthing 2 sets of twins.  Worse with eating or drinking. Has not been eating since yesterday.  No vomiting, had nausea one day.   Tried enema on Monday, thought it was constipation, now diarrhea.  No fevers. No urinary symptoms.  Had outpt US done that showed concern for cholecystitis.    Past Medical History:  Diagnosis Date  . Essential hypertension   . History of blood transfusion 1996   with c/s done in [place]  . SVD (spontaneous vaginal delivery) 1994   twins    Patient Active Problem List   Diagnosis Date Noted  . Cholecystitis 07/15/2017  . OSA (obstructive sleep apnea) 03/12/2017  . Essential hypertension 09/30/2013  . Murmur 09/30/2013  . SVD (spontaneous vaginal delivery)   . History of blood transfusion     Past Surgical History:  Procedure Laterality Date  . CESAREAN SECTION  1996   x 1 twins  . POLYPECTOMY  02/13/2012   Procedure: POLYPECTOMY;  Surgeon: Sharene Butters, MD;  Location: [facility];  Service: Gynecology;  Laterality: N/A;  . TUBAL LIGATION      OB History    No data available       Home Medications    Prior to Admission medications   Medication Sig Start Date End Date Taking? Authorizing Provider  amLODipine (NORVASC) 5 MG tablet Take 1 tablet (5 mg total) by mouth daily. 07/13/17  Yes [name], DO  dicyclomine (BENTYL) 20 MG tablet Take 1 tablet (20 mg total) by mouth 4 (four) times daily -  before meals and at bedtime. 07/13/17  Yes Wendling, Crosby Oyster, DO  losartan-hydrochlorothiazide (HYZAAR) 100-25 MG tablet Take 1 tablet by mouth daily. 07/13/17  Yes [name], DO  omeprazole (PRILOSEC) 20 MG capsule Take 1 capsule (20 mg total) by mouth daily. 06/18/17  Yes Palumbo, April, MD  polyethylene glycol powder (MIRALAX) powder Take 17 g by mouth daily. 06/18/17  Yes Palumbo, April, MD  traMADol (ULTRAM) 50 MG tablet Take 1 tablet (50 mg total) by mouth every 12 (twelve) hours as needed (Pain). 07/15/17   [name], DO    Family History Family History  Problem Relation Age of Onset  . Diabetes Mother   . Hypertension Mother   . Congenital heart disease Paternal Grandmother     Social History Social History   Tobacco Use  . Smoking status: Never Smoker  . Smokeless tobacco: Never Used  Substance Use Topics  . Alcohol use: No  . Drug use: No     Allergies   Patient has no known allergies.   Review of Systems Review of Systems  Constitutional: Negative for fever.  HENT: Negative for sore throat.   Eyes: Negative for visual disturbance.  Respiratory: Negative for cough and shortness of breath.   Cardiovascular: Negative for chest pain.  Gastrointestinal: Positive for abdominal pain, constipation, diarrhea and nausea. Negative for vomiting.  Genitourinary: Negative for difficulty urinating and dysuria.  Musculoskeletal: Negative for back pain and neck pain.  Skin: Negative for rash.  Neurological: Negative for syncope and headaches.     Physical Exam Updated Vital Signs BP (!) 169/89 (BP Location: Right Arm)   Pulse 66   Temp 97.7 F (36.5 C) (Oral)   Resp 18   Ht 5' 4"  (1.626 m)   Wt 88.8 kg (195 lb 11.2 oz)   LMP  (LMP Unknown)   SpO2 99%   BMI 33.59 kg/m   Physical Exam  Constitutional: She is oriented to person, place, and time. She appears well-developed and well-nourished. She appears distressed (in pain).  HENT:  Head: Normocephalic and atraumatic.  Eyes: Conjunctivae and EOM  are normal.  Neck: Normal range of motion.  Cardiovascular: Normal rate, regular rhythm, normal heart sounds and intact distal pulses. Exam reveals no gallop and no friction rub.  No murmur heard. Pulmonary/Chest: Effort normal and breath sounds normal. No respiratory distress. She has no wheezes. She has no rales.  Abdominal: Soft. She exhibits no distension. There is tenderness in the right upper quadrant. There is guarding and positive Murphy's sign.  Musculoskeletal: She exhibits no edema or tenderness.  Neurological: She is alert and oriented to person, place, and time.  Skin: Skin is warm and dry. No rash noted. She is not diaphoretic. No erythema.  Nursing note and vitals reviewed.    ED Treatments / Results  Labs (all labs ordered are listed, but only abnormal results are displayed) Labs Reviewed  COMPREHENSIVE METABOLIC PANEL - Abnormal; Notable for the following components:      Result Value   Potassium 3.2 (*)    Chloride 98 (*)    Glucose, Bld 128 (*)    Creatinine, Ser 1.05 (*)    Total Protein 9.4 (*)    All other components within normal limits  MRSA PCR SCREENING  CBC WITH DIFFERENTIAL/PLATELET  LIPASE, BLOOD  HIV ANTIBODY (ROUTINE TESTING)    EKG  EKG Interpretation None       Radiology US Abdomen Limited Ruq  Result Date: 07/15/2017 CLINICAL DATA:  Right upper quadrant pain for several weeks EXAM: ULTRASOUND ABDOMEN LIMITED RIGHT UPPER QUADRANT COMPARISON:  None. FINDINGS: Gallbladder: Mild distended with multiple gallstones and gallbladder sludge within. One of the stones appears lodged within the neck of the gallbladder. Gallbladder wall is mildly prominent. There are changes suggestive of a mildly distended cystic duct measuring 9 mm. Minimal pericholecystic fluid is noted. Common bile duct: Diameter: 4 mm. Liver: Focal area of increased echogenicity is noted in the left lobe of the liver measuring approximately 5.5 cm. This is suspicious for an underlying  hemangioma. Portal vein is patent on color Doppler imaging with normal direction of blood flow towards the liver. IMPRESSION: Distended gallbladder with gallstones and gallbladder sludge as well as findings suggestive of a gallstone within the gallbladder neck and dilatation of the cystic duct. Some wall thickening and pericholecystic fluid is noted suspicious for acute cholecystitis. Focus of increased echogenicity within the left lobe of the liver suggestive of hemangioma. Follow-up ultrasound in 6 months is recommended to assess for stability. Electronically Signed   By: Inez Catalina M.D.   On: 07/15/2017 18:52    Procedures Procedures (including critical care time)  Medications Ordered in ED Medications  heparin injection 5,000 Units (not administered)  dextrose 5 % and 0.45 % NaCl with KCl 20 mEq/L infusion (1,000 mLs Intravenous New Bag/Given 07/16/17 0131)  piperacillin-tazobactam (ZOSYN) IVPB 3.375 g (not administered)  HYDROcodone-acetaminophen (  NORCO/VICODIN) 5-325 MG per tablet 1-2 tablet (not administered)  morphine 2 MG/ML injection 1 mg (1 mg Intravenous Given 07/16/17 0131)  ondansetron (ZOFRAN-ODT) disintegrating tablet 4 mg (not administered)    Or  ondansetron (ZOFRAN) injection 4 mg (not administered)  pantoprazole (PROTONIX) injection 40 mg (40 mg Intravenous Given 07/16/17 0131)  hydrALAZINE (APRESOLINE) injection 10 mg (not administered)  morphine 4 MG/ML injection 4 mg (4 mg Intravenous Given 07/15/17 2028)  ondansetron (ZOFRAN) injection 4 mg (4 mg Intravenous Given 07/15/17 2028)  sodium chloride 0.9 % bolus 1,000 mL (0 mLs Intravenous Stopped 07/15/17 2134)  HYDROmorphone (DILAUDID) injection 1 mg (1 mg Intravenous Given 07/15/17 2050)  piperacillin-tazobactam (ZOSYN) IVPB 3.375 g (0 g Intravenous Stopped 07/15/17 2134)     Initial Impression / Assessment and Plan / ED Course  I have reviewed the triage vital signs and the nursing notes.  Pertinent labs & imaging results that  were available during my care of the patient were reviewed by me and considered in my medical decision making (see chart for details).     42 year old female with a history of hypertension presents with concern for right upper quadrant abdominal pain with outpatient ultrasound concerning for acute cholecystitis.  Reports intermittent pain over the last month, concerning for symptomatic cholelithiasis, however had constant pain develop on Sunday which has not improved.  She has significant right upper quadrant tenderness on exam, and severe pain in the ED, initially not relieved by morphine.  Labs show mild AK I.  No elevation in lipase or transaminase.  Reviewed ultrasound that was completed which shows concern for gallbladder distention, cholelithiasis with stone in the cystic duct, and pericholecystic fluid concerning for cholecystitis.  Given zosyn.  Discussed with Dr. Hassell Done.  Will have patient transferred by personal vehicle with her family, for evaluation and admission by general surgery.  Final Clinical Impressions(s) / ED Diagnoses   Final diagnoses:  Cholecystitis    ED Discharge Orders    None       Gareth Morgan, MD 07/16/17 [phone]

## 2017-07-15 NOTE — Telephone Encounter (Signed)
Pt. Called to request to have abdominal ultrasound sooner than Friday. Took Mira lax only one time since last OV. Used a Fleets enema without results. States pain "is the same."

## 2017-07-15 NOTE — Progress Notes (Signed)
Pharmacy Antibiotic Note  Novalyn Lajara is a 42 y.o. female admitted on 07/15/2017 with intra-abdominal infection.  Pharmacy has been consulted for Zosyn dosing. WBC wnl. SCr 1.05  Plan: -Zosyn 3.375 gm IV once over 30 minutes, then zosyn 3.375 gm IV Q 8 hours (EI infusion) -Monitor CBC, renal fx, and clinical progress   Height: 5' 4"  (162.6 cm) Weight: 193 lb 12.8 oz (87.9 kg) IBW/kg (Calculated) : 54.7  Temp (24hrs), Avg:98 F (36.7 C), Min:98 F (36.7 C), Max:98 F (36.7 C)  No results for input(s): WBC, CREATININE, LATICACIDVEN, VANCOTROUGH, VANCOPEAK, VANCORANDOM, GENTTROUGH, GENTPEAK, GENTRANDOM, TOBRATROUGH, TOBRAPEAK, TOBRARND, AMIKACINPEAK, AMIKACINTROU, AMIKACIN in the last 168 hours.  CrCl cannot be calculated (Patient's most recent lab result is older than the maximum 21 days allowed.).    No Known Allergies  Antimicrobials this admission: Zosyn 3/6 >>    Dose adjustments this admission: None  Microbiology results:   Thank you for allowing pharmacy to be a part of this patient's care.  Albertina Parr, PharmD., BCPS Clinical Pharmacist Clinical phone for 07/15/17 until 11pm: [phone] If after 11pm, please call main pharmacy at: [phone]

## 2017-07-16 ENCOUNTER — Observation Stay (HOSPITAL_COMMUNITY): Payer: 59 | Admitting: Anesthesiology

## 2017-07-16 ENCOUNTER — Encounter (HOSPITAL_COMMUNITY)
Admission: EM | Disposition: A | Discharge status: Home / Self Care | Payer: Self-pay | Source: Home / Self Care | Attending: Emergency Medicine

## 2017-07-16 ENCOUNTER — Encounter (HOSPITAL_COMMUNITY): Payer: Self-pay

## 2017-07-16 HISTORY — PX: CHOLECYSTECTOMY: SHX55

## 2017-07-16 LAB — MRSA PCR SCREENING: MRSA by PCR: NEGATIVE

## 2017-07-16 LAB — HIV ANTIBODY (ROUTINE TESTING W REFLEX): HIV Screen 4th Generation wRfx: NONREACTIVE

## 2017-07-16 SURGERY — LAPAROSCOPIC CHOLECYSTECTOMY WITH INTRAOPERATIVE CHOLANGIOGRAM
Anesthesia: General

## 2017-07-16 MED ORDER — ROCURONIUM BROMIDE 10 MG/ML (PF) SYRINGE
PREFILLED_SYRINGE | INTRAVENOUS | Status: DC | PRN
  Administered 2017-07-16: 50 mg via INTRAVENOUS

## 2017-07-16 MED ORDER — DEXAMETHASONE SODIUM PHOSPHATE 10 MG/ML IJ SOLN
INTRAMUSCULAR | Status: AC
  Filled 2017-07-16: qty 2

## 2017-07-16 MED ORDER — PHENYLEPHRINE 40 MCG/ML (10ML) SYRINGE FOR IV PUSH (FOR BLOOD PRESSURE SUPPORT)
PREFILLED_SYRINGE | INTRAVENOUS | Status: AC
  Filled 2017-07-16: qty 10

## 2017-07-16 MED ORDER — ONDANSETRON HCL 4 MG/2ML IJ SOLN
INTRAMUSCULAR | Status: AC
  Filled 2017-07-16: qty 4

## 2017-07-16 MED ORDER — LIDOCAINE HCL (PF) 1 % IJ SOLN
INTRAMUSCULAR | Status: DC | PRN
  Administered 2017-07-16: 11 mL

## 2017-07-16 MED ORDER — HEPARIN SODIUM (PORCINE) 5000 UNIT/ML IJ SOLN
5000.0000 [IU] | Freq: Three times a day (TID) | INTRAMUSCULAR | Status: DC
  Administered 2017-07-16 – 2017-07-17 (×3): 5000 [IU] via SUBCUTANEOUS
  Filled 2017-07-16 (×3): qty 1

## 2017-07-16 MED ORDER — PROPOFOL 10 MG/ML IV BOLUS
INTRAVENOUS | Status: DC | PRN
  Administered 2017-07-16: 290 mg via INTRAVENOUS

## 2017-07-16 MED ORDER — POTASSIUM CHLORIDE 10 MEQ/100ML IV SOLN
10.0000 meq | INTRAVENOUS | Status: AC
  Administered 2017-07-16 (×6): 10 meq via INTRAVENOUS
  Filled 2017-07-16 (×6): qty 100

## 2017-07-16 MED ORDER — PROPOFOL 10 MG/ML IV BOLUS
INTRAVENOUS | Status: AC
  Filled 2017-07-16: qty 20

## 2017-07-16 MED ORDER — METOPROLOL TARTRATE 5 MG/5ML IV SOLN: 5.0000 mg | Freq: Four times a day (QID) | INTRAVENOUS | Status: DC | PRN

## 2017-07-16 MED ORDER — DIPHENHYDRAMINE HCL 50 MG/ML IJ SOLN: 12.5000 mg | Freq: Four times a day (QID) | INTRAMUSCULAR | Status: DC | PRN

## 2017-07-16 MED ORDER — LIDOCAINE 2% (20 MG/ML) 5 ML SYRINGE
INTRAMUSCULAR | Status: AC
  Filled 2017-07-16: qty 5

## 2017-07-16 MED ORDER — PANTOPRAZOLE SODIUM 40 MG IV SOLR
40.0000 mg | Freq: Every day | INTRAVENOUS | Status: DC
  Administered 2017-07-16 (×2): 40 mg via INTRAVENOUS
  Filled 2017-07-16 (×2): qty 40

## 2017-07-16 MED ORDER — ONDANSETRON HCL 4 MG/2ML IJ SOLN
INTRAMUSCULAR | Status: DC | PRN
  Administered 2017-07-16: 4 mg via INTRAVENOUS

## 2017-07-16 MED ORDER — FENTANYL CITRATE (PF) 100 MCG/2ML IJ SOLN
25.0000 ug | INTRAMUSCULAR | Status: DC | PRN
  Administered 2017-07-16 (×2): 50 ug via INTRAVENOUS

## 2017-07-16 MED ORDER — PIPERACILLIN-TAZOBACTAM 3.375 G IVPB
3.3750 g | Freq: Three times a day (TID) | INTRAVENOUS | Status: DC
  Administered 2017-07-16 – 2017-07-17 (×3): 3.375 g via INTRAVENOUS
  Filled 2017-07-16 (×3): qty 50

## 2017-07-16 MED ORDER — LIDOCAINE 20MG/ML (2%) 15 ML SYRINGE OPTIME
INTRAMUSCULAR | Status: DC | PRN
  Administered 2017-07-16: 1.5 mg/kg/h via INTRAVENOUS

## 2017-07-16 MED ORDER — SUGAMMADEX SODIUM 200 MG/2ML IV SOLN
INTRAVENOUS | Status: AC
  Filled 2017-07-16: qty 2

## 2017-07-16 MED ORDER — ONDANSETRON 4 MG PO TBDP: 4.0000 mg | ORAL_TABLET | Freq: Four times a day (QID) | ORAL | Status: DC | PRN

## 2017-07-16 MED ORDER — DEXAMETHASONE SODIUM PHOSPHATE 10 MG/ML IJ SOLN
INTRAMUSCULAR | Status: DC | PRN
  Administered 2017-07-16: 10 mg via INTRAVENOUS

## 2017-07-16 MED ORDER — FENTANYL CITRATE (PF) 100 MCG/2ML IJ SOLN
INTRAMUSCULAR | Status: AC
  Filled 2017-07-16: qty 2

## 2017-07-16 MED ORDER — LACTATED RINGERS IV SOLN
INTRAVENOUS | Status: DC
  Administered 2017-07-16: 12:00:00 via INTRAVENOUS

## 2017-07-16 MED ORDER — ACETAMINOPHEN 500 MG PO TABS: 500.0000 mg | ORAL_TABLET | Freq: Four times a day (QID) | ORAL | Status: DC | PRN

## 2017-07-16 MED ORDER — HYDRALAZINE HCL 20 MG/ML IJ SOLN
10.0000 mg | INTRAMUSCULAR | Status: DC | PRN
  Administered 2017-07-16 (×2): 10 mg via INTRAVENOUS
  Filled 2017-07-16 (×2): qty 1

## 2017-07-16 MED ORDER — LACTATED RINGERS IR SOLN
Status: DC | PRN
  Administered 2017-07-16: 1000 mL

## 2017-07-16 MED ORDER — FENTANYL CITRATE (PF) 100 MCG/2ML IJ SOLN
INTRAMUSCULAR | Status: DC | PRN
  Administered 2017-07-16: 100 ug via INTRAVENOUS

## 2017-07-16 MED ORDER — BUPIVACAINE HCL (PF) 0.25 % IJ SOLN
INTRAMUSCULAR | Status: AC
  Filled 2017-07-16: qty 30

## 2017-07-16 MED ORDER — LIDOCAINE 2% (20 MG/ML) 5 ML SYRINGE
INTRAMUSCULAR | Status: DC | PRN
  Administered 2017-07-16: 80 mg via INTRAVENOUS

## 2017-07-16 MED ORDER — ONDANSETRON HCL 4 MG/2ML IJ SOLN: 4.0000 mg | Freq: Once | INTRAMUSCULAR | Status: DC | PRN

## 2017-07-16 MED ORDER — PHENYLEPHRINE 40 MCG/ML (10ML) SYRINGE FOR IV PUSH (FOR BLOOD PRESSURE SUPPORT)
PREFILLED_SYRINGE | INTRAVENOUS | Status: DC | PRN
  Administered 2017-07-16 (×4): 80 ug via INTRAVENOUS
  Administered 2017-07-16: 40 ug via INTRAVENOUS

## 2017-07-16 MED ORDER — MORPHINE SULFATE (PF) 2 MG/ML IV SOLN
1.0000 mg | INTRAVENOUS | Status: DC | PRN
  Administered 2017-07-16 (×2): 1 mg via INTRAVENOUS
  Filled 2017-07-16 (×2): qty 1

## 2017-07-16 MED ORDER — MIDAZOLAM HCL 2 MG/2ML IJ SOLN
INTRAMUSCULAR | Status: AC
  Filled 2017-07-16: qty 2

## 2017-07-16 MED ORDER — LIDOCAINE-EPINEPHRINE 1 %-1:100000 IJ SOLN
INTRAMUSCULAR | Status: AC
  Filled 2017-07-16: qty 1

## 2017-07-16 MED ORDER — HYDROCODONE-ACETAMINOPHEN 5-325 MG PO TABS
1.0000 | ORAL_TABLET | ORAL | Status: DC | PRN
  Administered 2017-07-16: 2 via ORAL
  Filled 2017-07-16: qty 2

## 2017-07-16 MED ORDER — MIDAZOLAM HCL 5 MG/5ML IJ SOLN
INTRAMUSCULAR | Status: DC | PRN
  Administered 2017-07-16: 2 mg via INTRAVENOUS

## 2017-07-16 MED ORDER — IOPAMIDOL (ISOVUE-300) INJECTION 61%
INTRAVENOUS | Status: AC
  Filled 2017-07-16: qty 50

## 2017-07-16 MED ORDER — BUPIVACAINE HCL (PF) 0.25 % IJ SOLN
INTRAMUSCULAR | Status: DC | PRN
  Administered 2017-07-16: 11 mL

## 2017-07-16 MED ORDER — LACTATED RINGERS IV SOLN
INTRAVENOUS | Status: DC | PRN
  Administered 2017-07-16 (×2): via INTRAVENOUS

## 2017-07-16 MED ORDER — ROCURONIUM BROMIDE 10 MG/ML (PF) SYRINGE
PREFILLED_SYRINGE | INTRAVENOUS | Status: AC
  Filled 2017-07-16: qty 5

## 2017-07-16 MED ORDER — SUGAMMADEX SODIUM 200 MG/2ML IV SOLN
INTRAVENOUS | Status: DC | PRN
  Administered 2017-07-16: 200 mg via INTRAVENOUS

## 2017-07-16 MED ORDER — ONDANSETRON HCL 4 MG/2ML IJ SOLN: 4.0000 mg | Freq: Four times a day (QID) | INTRAMUSCULAR | Status: DC | PRN

## 2017-07-16 MED ORDER — KCL IN DEXTROSE-NACL 20-5-0.45 MEQ/L-%-% IV SOLN
INTRAVENOUS | Status: DC
  Administered 2017-07-16 (×2): 1000 mL via INTRAVENOUS
  Filled 2017-07-16 (×2): qty 1000

## 2017-07-16 SURGICAL SUPPLY — 38 items
APPLIER CLIP ROT 10 11.4 M/L (STAPLE)
CABLE HIGH FREQUENCY MONO STRZ (ELECTRODE) ×2 IMPLANT
CHLORAPREP W/TINT 26ML (MISCELLANEOUS) ×2 IMPLANT
CLIP APPLIE ROT 10 11.4 M/L (STAPLE) IMPLANT
CLIP VESOLOCK MED LG 6/CT (CLIP) IMPLANT
COVER MAYO STAND STRL (DRAPES) ×2 IMPLANT
COVER SURGICAL LIGHT HANDLE (MISCELLANEOUS) ×2 IMPLANT
DECANTER SPIKE VIAL GLASS SM (MISCELLANEOUS) ×2 IMPLANT
DERMABOND ADVANCED (GAUZE/BANDAGES/DRESSINGS) ×1
DERMABOND ADVANCED .7 DNX12 (GAUZE/BANDAGES/DRESSINGS) ×1 IMPLANT
DRAPE C-ARM 42X120 X-RAY (DRAPES) IMPLANT
ELECT L-HOOK LAP 45CM DISP (ELECTROSURGICAL)
ELECT PENCIL ROCKER SW 15FT (MISCELLANEOUS) ×2 IMPLANT
ELECT REM PT RETURN 15FT ADLT (MISCELLANEOUS) ×2 IMPLANT
ELECTRODE L-HOOK LAP 45CM DISP (ELECTROSURGICAL) IMPLANT
GLOVE BIO SURGEON STRL SZ 6 (GLOVE) ×2 IMPLANT
GLOVE INDICATOR 6.5 STRL GRN (GLOVE) ×2 IMPLANT
GOWN STRL REUS W/TWL 2XL LVL3 (GOWN DISPOSABLE) ×2 IMPLANT
GOWN STRL REUS W/TWL XL LVL3 (GOWN DISPOSABLE) ×4 IMPLANT
HEMOSTAT SNOW SURGICEL 2X4 (HEMOSTASIS) ×2 IMPLANT
KIT BASIN OR (CUSTOM PROCEDURE TRAY) ×2 IMPLANT
L-HOOK LAP DISP 36CM (ELECTROSURGICAL) ×2
LHOOK LAP DISP 36CM (ELECTROSURGICAL) ×1 IMPLANT
POSITIONER SURGICAL ARM (MISCELLANEOUS) IMPLANT
POUCH SPECIMEN RETRIEVAL 10MM (ENDOMECHANICALS) ×2 IMPLANT
SCISSORS LAP 5X35 DISP (ENDOMECHANICALS) ×2 IMPLANT
SET CHOLANGIOGRAPH MIX (MISCELLANEOUS) IMPLANT
SET IRRIG TUBING LAPAROSCOPIC (IRRIGATION / IRRIGATOR) ×2 IMPLANT
SLEEVE XCEL OPT CAN 5 100 (ENDOMECHANICALS) ×2 IMPLANT
SUT MNCRL AB 4-0 PS2 18 (SUTURE) ×2 IMPLANT
TAPE CLOTH 4X10 WHT NS (GAUZE/BANDAGES/DRESSINGS) IMPLANT
TOWEL OR 17X26 10 PK STRL BLUE (TOWEL DISPOSABLE) ×2 IMPLANT
TOWEL OR NON WOVEN STRL DISP B (DISPOSABLE) ×2 IMPLANT
TRAY LAPAROSCOPIC (CUSTOM PROCEDURE TRAY) ×2 IMPLANT
TROCAR BLADELESS OPT 5 100 (ENDOMECHANICALS) ×2 IMPLANT
TROCAR XCEL BLUNT TIP 100MML (ENDOMECHANICALS) ×2 IMPLANT
TROCAR XCEL NON-BLD 11X100MML (ENDOMECHANICALS) ×2 IMPLANT
TUBING INSUF HEATED (TUBING) ×2 IMPLANT

## 2017-07-16 NOTE — Telephone Encounter (Signed)
Since she's inpt, will cancel for now. TY.

## 2017-07-16 NOTE — Telephone Encounter (Signed)
Advise to send to correct pharmacy She is currently in the hosptal

## 2017-07-16 NOTE — Anesthesia Procedure Notes (Addendum)
Procedure Name: Intubation Date/Time: 07/16/2017 12:41 PM Performed by: Lavina Hamman, CRNA Pre-anesthesia Checklist: Patient identified, Emergency Drugs available, Suction available and Patient being monitored Patient Re-evaluated:Patient Re-evaluated prior to induction Oxygen Delivery Method: Circle System Utilized Preoxygenation: Pre-oxygenation with 100% oxygen Induction Type: IV induction Ventilation: Mask ventilation without difficulty Laryngoscope Size: Mac and 3 Grade View: Grade I Tube type: Oral Tube size: 7.0 mm Number of attempts: 1 Airway Equipment and Method: Stylet and Oral airway Placement Confirmation: ETT inserted through vocal cords under direct vision,  positive ETCO2 and breath sounds checked- equal and bilateral Tube secured with: Tape Dental Injury: Teeth and Oropharynx as per pre-operative assessment

## 2017-07-16 NOTE — ED Notes (Signed)
ED TO INPATIENT HANDOFF REPORT  Name/Age/Gender Kaitlyn Stevens 42 y.o. female  Code Status Code Status History    This patient does not have a recorded code status. Please follow your organizational policy for patients in this situation.      Home/SNF/Other Home  Chief Complaint right side abdominal pain.Marland KitchenUltra sound viewed gallstones  Level of Care/Admitting Diagnosis ED Disposition    ED Disposition Condition [facility] Area: [facility] [100102]  Level of Care: Med-Surg [16]  Diagnosis: Cholecystitis [638466]  Admitting Physician: [facility]CS, [place]  Attending Physician: CCS, MD [[id number]]  PT Class (Do Not Modify): Observation [104]  PT Acc Code (Do Not Modify): Observation [10022]       Medical History Past Medical History:  Diagnosis Date  . Essential hypertension   . History of blood transfusion 1996   with c/s done in [place]  . SVD (spontaneous vaginal delivery) 1994   twins    Allergies No Known Allergies  IV Location/Drains/Wounds Patient Lines/Drains/Airways Status   Active Line/Drains/Airways    Name:   Placement date:   Placement time:   Site:   Days:   Peripheral IV 07/15/17 Left Antecubital   07/15/17    1933    Antecubital   1          Labs/Imaging Results for orders placed or performed during the hospital encounter of 07/15/17 (from the past 48 hour(s))  CBC with Differential     Status: None   Collection Time: 07/15/17  7:36 PM  Result Value Ref Range   WBC 9.8 4.0 - 10.5 K/uL   RBC 4.65 3.87 - 5.11 MIL/uL   Hemoglobin 13.9 12.0 - 15.0 g/dL   HCT 39.5 36.0 - 46.0 %   MCV 84.9 78.0 - 100.0 fL   MCH 29.9 26.0 - 34.0 pg   MCHC 35.2 30.0 - 36.0 g/dL   RDW 13.1 11.5 - 15.5 %   Platelets 397 150 - 400 K/uL   Neutrophils Relative % 68 %   Neutro Abs 6.7 1.7 - 7.7 K/uL   Lymphocytes Relative 22 %   Lymphs Abs 2.2 0.7 - 4.0 K/uL   Monocytes Relative 9 %   Monocytes Absolute 0.8 0.1 - 1.0 K/uL   Eosinophils  Relative 1 %   Eosinophils Absolute 0.1 0.0 - 0.7 K/uL   Basophils Relative 0 %   Basophils Absolute 0.0 0.0 - 0.1 K/uL    Comment: Performed at [facility], [address] [place], [place] [address]  Comprehensive metabolic panel     Status: Abnormal   Collection Time: 07/15/17  7:36 PM  Result Value Ref Range   Sodium 137 135 - 145 mmol/L   Potassium 3.2 (L) 3.5 - 5.1 mmol/L   Chloride 98 (L) 101 - 111 mmol/L   CO2 28 22 - 32 mmol/L   Glucose, Bld 128 (H) 65 - 99 mg/dL   BUN 11 6 - 20 mg/dL   Creatinine, Ser 1.05 (H) 0.44 - 1.00 mg/dL   Calcium 9.1 8.9 - 10.3 mg/dL   Total Protein 9.4 (H) 6.5 - 8.1 g/dL   Albumin 4.7 3.5 - 5.0 g/dL   AST 22 15 - 41 U/L   ALT 16 14 - 54 U/L   Alkaline Phosphatase 64 38 - 126 U/L   Total Bilirubin 0.5 0.3 - 1.2 mg/dL   GFR calc non Af Amer >60 >60 mL/min   GFR calc Af Amer >60 >60  mL/min    Comment: (NOTE) The eGFR has been calculated using the CKD EPI equation. This calculation has not been validated in all clinical situations. eGFR's persistently <60 mL/min signify possible Chronic Kidney Disease.    Anion gap 11 5 - 15    Comment: Performed at [facility], [address] [place], [place] [address]  Lipase, blood     Status: None   Collection Time: 07/15/17  7:36 PM  Result Value Ref Range   Lipase 25 11 - 51 U/L    Comment: Performed at [facility], [address] [place], [place] [address]   US Abdomen Limited Ruq  Result Date: 07/15/2017 CLINICAL DATA:  Right upper quadrant pain for several weeks EXAM: ULTRASOUND ABDOMEN LIMITED RIGHT UPPER QUADRANT COMPARISON:  None. FINDINGS: Gallbladder: Mild distended with multiple gallstones and gallbladder sludge within. One of the stones appears lodged within the neck of the gallbladder. Gallbladder wall is mildly prominent. There are changes suggestive of a mildly distended cystic duct measuring 9 mm. Minimal pericholecystic fluid is noted. Common bile duct:  Diameter: 4 mm. Liver: Focal area of increased echogenicity is noted in the left lobe of the liver measuring approximately 5.5 cm. This is suspicious for an underlying hemangioma. Portal vein is patent on color Doppler imaging with normal direction of blood flow towards the liver. IMPRESSION: Distended gallbladder with gallstones and gallbladder sludge as well as findings suggestive of a gallstone within the gallbladder neck and dilatation of the cystic duct. Some wall thickening and pericholecystic fluid is noted suspicious for acute cholecystitis. Focus of increased echogenicity within the left lobe of the liver suggestive of hemangioma. Follow-up ultrasound in 6 months is recommended to assess for stability. Electronically Signed   By: Inez Catalina M.D.   On: 07/15/2017 18:52    Pending Labs [employer] (From admission, onward)   Start     Ordered   Signed and Held  HIV antibody (Routine Testing)  Once,   R     Signed and Held   Signed and Held  CBC  (heparin)  Once,   R    Comments:  Baseline for heparin therapy IF NOT ALREADY DRAWN.  Notify MD if PLT < 100 K.    Signed and Held   Signed and Held  Creatinine, serum  (heparin)  Once,   R    Comments:  Baseline for heparin therapy IF NOT ALREADY DRAWN.    Signed and Held      Vitals/Pain Today's Vitals   07/15/17 2045 07/15/17 2102 07/15/17 2130 07/15/17 2131  BP:    (!) 162/84  Pulse:    76  Resp:    16  Temp:      TempSrc:      SpO2:      Weight:      Height:      PainSc: 10-Worst pain ever 6  6      Isolation Precautions No active isolations  Medications Medications  piperacillin-tazobactam (ZOSYN) IVPB 3.375 g (not administered)  morphine 4 MG/ML injection 4 mg (4 mg Intravenous Given 07/15/17 2028)  ondansetron (ZOFRAN) injection 4 mg (4 mg Intravenous Given 07/15/17 2028)  sodium chloride 0.9 % bolus 1,000 mL (0 mLs Intravenous Stopped 07/15/17 2134)  HYDROmorphone (DILAUDID) injection 1 mg (1 mg Intravenous Given  07/15/17 2050)  piperacillin-tazobactam (ZOSYN) IVPB 3.375 g (0 g Intravenous Stopped 07/15/17 2134)    Mobility Walks  Gave report to [employer]

## 2017-07-16 NOTE — Discharge Instructions (Signed)
Please arrive at least 30 min before your appointment to complete your check in paperwork.  If you are unable to arrive 30 min prior to your appointment time we may have to cancel or reschedule you.  LAPAROSCOPIC SURGERY: POST OP INSTRUCTIONS  1. DIET: Follow a light bland diet the first 24 hours after arrival home, such as soup, liquids, crackers, etc. Be sure to include lots of fluids daily. Avoid fast food or heavy meals as your are more likely to get nauseated. Eat a low fat the next few days after surgery.  2. Take your usually prescribed home medications unless otherwise directed. 3. PAIN CONTROL:  1. Pain is best controlled by a usual combination of three different methods TOGETHER:  1. Ice/Heat 2. Over the counter pain medication 3. Prescription pain medication 2. Most patients will experience some swelling and bruising around the incisions. Ice packs or heating pads (30-60 minutes up to 6 times a day) will help. Use ice for the first few days to help decrease swelling and bruising, then switch to heat to help relax tight/sore spots and speed recovery. Some people prefer to use ice alone, heat alone, alternating between ice & heat. Experiment to what works for you. Swelling and bruising can take several weeks to resolve.  3. It is helpful to take an over-the-counter pain medication regularly for the first few weeks. Choose one of the following that works best for you:  1. Naproxen (Aleve, etc) Two 242m tabs twice a day 2. Ibuprofen (Advil, etc) Three 2062mtabs four times a day (every meal & bedtime) 3. Acetaminophen (Tylenol, etc) 500-6502mour times a day (every meal & bedtime) 4. A prescription for pain medication (such as oxycodone, hydrocodone, etc) should be given to you upon discharge. Take your pain medication as prescribed.  1. If you are having problems/concerns with the prescription medicine (does not control pain, nausea, vomiting, rash, itching, etc), please call us [phone])  [phone] to see if we need to switch you to a different pain medicine that will work better for you and/or control your side effect better. 2. If you need a refill on your pain medication, please contact your pharmacy. They will contact our office to request authorization. Prescriptions will not be filled after 5 pm or on week-ends. 4. Avoid getting constipated. Between the surgery and the pain medications, it is common to experience some constipation. Increasing fluid intake and taking a fiber supplement (such as Metamucil, Citrucel, FiberCon, MiraLax, etc) 1-2 times a day regularly will usually help prevent this problem from occurring. A mild laxative (prune juice, Milk of Magnesia, MiraLax, etc) should be taken according to package directions if there are no bowel movements after 48 hours.  5. Watch out for diarrhea. If you have many loose bowel movements, simplify your diet to bland foods & liquids for a few days. Stop any stool softeners and decrease your fiber supplement. Switching to mild anti-diarrheal medications (Kayopectate, Pepto Bismol) can help. If this worsens or does not improve, please call us.[place]. Wash / shower every day. You may shower over the dressings as they are waterproof. Continue to shower over incision(s) after the dressing is off. If there is glue over the incisions try not to pick it off, let it fall off naturally. 7. Remove your waterproof bandages 2 days after surgery. You may leave the incision open to air. You may replace a dressing/Band-Aid to cover the incision for comfort if you wish.  8. ACTIVITIES as tolerated:  1. You may resume regular (light) daily activities beginning the next day--such as daily self-care, walking, climbing stairs--gradually increasing activities as tolerated. If you can walk 30 minutes without difficulty, it is safe to try more intense activity such as jogging, treadmill, bicycling, low-impact aerobics, swimming, etc. 2. Save the most intensive and  strenuous activity for last such as sit-ups, heavy lifting, contact sports, etc Refrain from any heavy lifting or straining until you are off narcotics for pain control. For the first 2-3 weeks do not lift over 10-15lb.  3. DO NOT PUSH THROUGH PAIN. Let pain be your guide: If it hurts to do something, don't do it. Pain is your body warning you to avoid that activity for another week until the pain goes down. 4. You may drive when you are no longer taking prescription pain medication, you can comfortably wear a seatbelt, and you can safely maneuver your car and apply brakes. 5. You may have sexual intercourse when it is comfortable.  9. FOLLOW UP in our office  1. Please call [facility] at [phone] to set up an appointment to see your surgeon in the office for a follow-up appointment approximately 2-3 weeks after your surgery. 2. Make sure that you call for this appointment the day you arrive home to insure a convenient appointment time.      10. IF YOU HAVE DISABILITY OR FAMILY LEAVE FORMS, BRING THEM TO THE               OFFICE FOR PROCESSING.   WHEN TO CALL us [phone]:  1. Poor pain control 2. Reactions / problems with new medications (rash/itching, nausea, etc)  3. Fever over 101.5 F (38.5 C) 4. Inability to urinate 5. Nausea and/or vomiting 6. Worsening swelling or bruising 7. Continued bleeding from incision. 8. Increased pain, redness, or drainage from the incision  The clinic staff is available to answer your questions during regular business hours (8:30am-5pm). Please dont hesitate to call and ask to speak to one of our nurses for clinical concerns.  If you have a medical emergency, go to the nearest emergency room or call 911.  A surgeon from [facility] is always on call at the [facility], [address], [place], [place], [place] [address] ?  MAIN: [phone] ? TOLL FREE: [phone] ?  FAX [phone]    [email or web address]

## 2017-07-16 NOTE — Anesthesia Preprocedure Evaluation (Addendum)
Anesthesia Evaluation  Patient identified by MRN, date of birth, ID band Patient awake    Reviewed: Allergy & Precautions, NPO status , Patient's Chart, lab work & pertinent test results  Airway Mallampati: II  TM Distance: >3 FB Neck ROM: Full    Dental  (+) Dental Advisory Given   Pulmonary neg pulmonary ROS,    breath sounds clear to auscultation       Cardiovascular hypertension, Pt. on medications  Rhythm:Regular Rate:Normal     Neuro/Psych negative neurological ROS  negative psych ROS   GI/Hepatic negative GI ROS, Neg liver ROS,   Endo/Other  negative endocrine ROS  Renal/GU negative Renal ROS     Musculoskeletal negative musculoskeletal ROS (+)   Abdominal (+) + obese,   Peds  Hematology negative hematology ROS (+)   Anesthesia Other Findings Acute cholecystitis  Reproductive/Obstetrics                            Lab Results  Component Value Date   WBC 9.8 07/15/2017   HGB 13.9 07/15/2017   HCT 39.5 07/15/2017   MCV 84.9 07/15/2017   PLT 397 07/15/2017   Lab Results  Component Value Date   CREATININE 1.05 (H) 07/15/2017   BUN 11 07/15/2017   NA 137 07/15/2017   K 3.2 (L) 07/15/2017   CL 98 (L) 07/15/2017   CO2 28 07/15/2017    Anesthesia Physical Anesthesia Plan  ASA: II  Anesthesia Plan: General   Post-op Pain Management:    Induction: Intravenous  PONV Risk Score and Plan: 3 and Ondansetron, Dexamethasone, Midazolam and Treatment may vary due to age or medical condition  Airway Management Planned: Oral ETT  Additional Equipment:   Intra-op Plan:   Post-operative Plan: Extubation in OR  Informed Consent: I have reviewed the patients History and Physical, chart, labs and discussed the procedure including the risks, benefits and alternatives for the proposed anesthesia with the patient or authorized representative who has indicated his/her understanding and  acceptance.   Dental advisory given  Plan Discussed with: CRNA  Anesthesia Plan Comments:        Anesthesia Quick Evaluation

## 2017-07-16 NOTE — Op Note (Signed)
Laparoscopic Cholecystectomy  Indications: This patient presents with acute calculous cholecystitis and will undergo laparoscopic cholecystectomy.  Pre-operative Diagnosis: acute calculous cholecystitis  Post-operative Diagnosis: Same  Surgeon: Stark Klein   Assistants: Izora Gala, PA-C  Anesthesia: General endotracheal anesthesia and local  ASA Class: 2  Procedure Details  The patient was seen again in the Holding Room. The risks, benefits, complications, treatment options, and expected outcomes were discussed with the patient. The possibilities of  bleeding, recurrent infection, damage to nearby structures, the need for additional procedures, failure to diagnose a condition, the possible need to convert to an open procedure, and creating a complication requiring transfusion or operation were discussed with the patient. The likelihood of improving the patient's symptoms with return to their baseline status is good.    The patient and/or family concurred with the proposed plan, giving informed consent. The site of surgery properly noted. The patient was taken to Operating Room, and the procedure verified as Laparoscopic Cholecystectomy with possible Intraoperative Cholangiogram. A Time Out was held and the above information confirmed.  Prior to the induction of general anesthesia, antibiotic prophylaxis was administered. General endotracheal anesthesia was then administered and tolerated well. After the induction, the abdomen was prepped with Chloraprep and draped in the sterile fashion. The patient was positioned in the supine position.  Local anesthetic agent was injected into the skin near the umbilicus and an incision made. We dissected down to the abdominal fascia with blunt dissection.  The fascia was incised vertically and we entered the peritoneal cavity bluntly.  A pursestring suture of 0-Vicryl was placed around the fascial opening.  The Hasson cannula was inserted and secured  with the stay suture.  Pneumoperitoneum was then created with CO2 and tolerated well without any adverse changes in the patient's vital signs. An 11-mm port was placed in the subxiphoid position.  Two 5-mm ports were placed in the right upper quadrant. All skin incisions were infiltrated with a local anesthetic agent before making the incision and placing the trocars.   We positioned the patient in reverse Trendelenburg, tilted slightly to the patient's left.  The gallbladder was identified, the fundus grasped and retracted cephalad. Adhesions were lysed bluntly and with the electrocautery where indicated, taking care not to injure any adjacent organs or viscus. The gallbladder was too distended to grasp, so the Nezhat suction was used to aspirate the gallbladder.  The bile was purulent and white.    A stone was lodged in the gallbladder neck.  The infundibulum was grasped and retracted laterally, exposing the peritoneum overlying the triangle of Calot. This was then divided and exposed in a blunt fashion. A critical view of the cystic duct and cystic artery was obtained.  The cystic duct was clearly identified and bluntly dissected circumferentially. It was very small with the stone lodged above it.  Three clips were placed proximally on the cystic duct, and one distally.  The cystic artery was identified, dissected free, ligated with clips and divided as well.   The gallbladder was dissected from the liver bed in retrograde fashion with the electrocautery. The gallbladder was removed and placed in an Endocatch bag.  The gallbladder and Endocatch bag were then removed through the umbilical port site.  The liver bed was irrigated and inspected. Hemostasis was achieved with the electrocautery. Copious irrigation was utilized and was repeatedly aspirated until clear.    We again inspected the right upper quadrant for hemostasis.  Pneumoperitoneum was released as we removed the trocars.  The pursestring suture  was used to close the umbilical fascia.  4-0 Monocryl was used to close the skin.   The skin was cleaned and dry, and Dermabond was applied. The patient was then extubated and brought to the recovery room in stable condition. Instrument, sponge, and needle counts were correct at closure and at the conclusion of the case.   Findings: Distended gallbladder with white bile.  Acute inflammation.  Stone in gallbladder neck.    Estimated Blood Loss: min         Drains: none.            Specimens: Gallbladder to pathology       Complications: None; patient tolerated the procedure well.         Disposition: PACU - hemodynamically stable.         Condition: stable

## 2017-07-16 NOTE — Progress Notes (Signed)
[facility] Progress Note  Day of Surgery  Subjective: CC: abdominal pain Pain slightly improved from last night but still present. No further questions about surgery. Wanted to know if her children could be here while she has surgery.  UOP good. BP high.   Objective: Vital signs in last 24 hours: Temp:  [97.7 F (36.5 C)-98 F (36.7 C)] 98 F (36.7 C) (03/07 0533) Pulse Rate:  [66-101] 101 (03/07 0640) Resp:  [16-20] 18 (03/07 0533) BP: (151-188)/(65-97) 151/65 (03/07 0640) SpO2:  [97 %-100 %] 97 % (03/07 0533) Weight:  [87.9 kg (193 lb 12.8 oz)-88.8 kg (195 lb 11.2 oz)] 88.8 kg (195 lb 11.2 oz) (03/07 0057) Last BM Date: 07/16/17  Intake/Output from previous day: 03/06 0701 - 03/07 0700 In: 1548.3 [I.V.:448.3; IV Piggyback:1100] Out: 400 [Urine:400] Intake/Output this shift: No intake/output data recorded.  PE: Gen:  Alert, NAD, pleasant Card:  Regular rate and rhythm, pedal pulses 2+ BL Pulm:  Normal effort, clear to auscultation bilaterally Abd: Soft, TTP in RUQ, non-distended, bowel sounds present, no HSM Skin: warm and dry, no rashes  Psych: A&Ox3   Lab Results:  Recent Labs    07/15/17 1936  WBC 9.8  HGB 13.9  HCT 39.5  PLT 397   BMET Recent Labs    07/15/17 1936  NA 137  K 3.2*  CL 98*  CO2 28  GLUCOSE 128*  BUN 11  CREATININE 1.05*  CALCIUM 9.1   PT/INR No results for input(s): LABPROT, INR in the last 72 hours. CMP     Component Value Date/Time   NA 137 07/15/2017 1936   K 3.2 (L) 07/15/2017 1936   CL 98 (L) 07/15/2017 1936   CO2 28 07/15/2017 1936   GLUCOSE 128 (H) 07/15/2017 1936   BUN 11 07/15/2017 1936   CREATININE 1.05 (H) 07/15/2017 1936   CALCIUM 9.1 07/15/2017 1936   PROT 9.4 (H) 07/15/2017 1936   ALBUMIN 4.7 07/15/2017 1936   AST 22 07/15/2017 1936   ALT 16 07/15/2017 1936   ALKPHOS 64 07/15/2017 1936   BILITOT 0.5 07/15/2017 1936   GFRNONAA >60 07/15/2017 1936   GFRAA >60 07/15/2017 1936   Lipase      Component Value Date/Time   LIPASE 25 07/15/2017 1936       Studies/Results: US Abdomen Limited Ruq  Result Date: 07/15/2017 CLINICAL DATA:  Right upper quadrant pain for several weeks EXAM: ULTRASOUND ABDOMEN LIMITED RIGHT UPPER QUADRANT COMPARISON:  None. FINDINGS: Gallbladder: Mild distended with multiple gallstones and gallbladder sludge within. One of the stones appears lodged within the neck of the gallbladder. Gallbladder wall is mildly prominent. There are changes suggestive of a mildly distended cystic duct measuring 9 mm. Minimal pericholecystic fluid is noted. Common bile duct: Diameter: 4 mm. Liver: Focal area of increased echogenicity is noted in the left lobe of the liver measuring approximately 5.5 cm. This is suspicious for an underlying hemangioma. Portal vein is patent on color Doppler imaging with normal direction of blood flow towards the liver. IMPRESSION: Distended gallbladder with gallstones and gallbladder sludge as well as findings suggestive of a gallstone within the gallbladder neck and dilatation of the cystic duct. Some wall thickening and pericholecystic fluid is noted suspicious for acute cholecystitis. Focus of increased echogenicity within the left lobe of the liver suggestive of hemangioma. Follow-up ultrasound in 6 months is recommended to assess for stability. Electronically Signed   By: Inez Catalina M.D.   On: 07/15/2017 18:52  Anti-infectives: Anti-infectives (From admission, onward)   Start     Dose/Rate Route Frequency Ordered Stop   07/16/17 0400  piperacillin-tazobactam (ZOSYN) IVPB 3.375 g  Status:  Discontinued     3.375 g 12.5 mL/hr over 240 Minutes Intravenous Every 8 hours 07/15/17 2152 07/16/17 0055   07/16/17 0300  piperacillin-tazobactam (ZOSYN) IVPB 3.375 g     3.375 g 12.5 mL/hr over 240 Minutes Intravenous Every 8 hours 07/16/17 0055     07/15/17 2100  piperacillin-tazobactam (ZOSYN) IVPB 3.375 g     3.375 g 100 mL/hr over 30 Minutes  Intravenous  Once 07/15/17 2049 07/15/17 2134       Assessment/Plan HTN - BP elevated, add lopressor for BP control pre-op; will transition back to home meds post-op  Cholelithiasis with likely early acute cholecystitis - [place]: gallstones and wall thickening with pericholecystic fluid  - WBC 9.8, afebrile - LFTs not elevated - plan for lap chole later today with Dr. Barry Dienes  FEN: NPO, IVF VTE: SCDs, SQ heparin - last dose 0500 ID: IV zosyn 3/6>>   LOS: 0 days    Brigid Re , [facility] 07/16/2017, 8:22 AM Pager: [phone] Consults: [phone] Mon-Fri 7:00 am-4:30 pm Sat-Sun 7:00 am-11:30 am

## 2017-07-16 NOTE — Interval H&P Note (Signed)
History and Physical Interval Note:  07/16/2017 12:16 PM  Kaitlyn Stevens  has presented today for surgery, with the diagnosis of Acute cholecystitis  The various methods of treatment have been discussed with the patient and family. After consideration of risks, benefits and other options for treatment, the patient has consented to  Procedure(s): LAPAROSCOPIC CHOLECYSTECTOMY WITH INTRAOPERATIVE CHOLANGIOGRAM (N/A) as a surgical intervention .  The patient's history has been reviewed, patient examined, no change in status, stable for surgery.  I have reviewed the patient's chart and labs.  Questions were answered to the patient's satisfaction.     Stark Klein

## 2017-07-16 NOTE — Transfer of Care (Signed)
Immediate Anesthesia Transfer of Care Note  Patient: Kaitlyn Stevens  Procedure(s) Performed: LAPAROSCOPIC CHOLECYSTECTOMY (N/A )  Patient Location: PACU  Anesthesia Type:General  Level of Consciousness: awake, alert  and responds to stimulation  Airway & Oxygen Therapy: Patient Spontanous Breathing  Post-op Assessment: Report given to RN, Post -op Vital signs reviewed and stable and Patient moving all extremities  Post vital signs: Reviewed and stable  Last Vitals:  Vitals:   07/16/17 1000 07/16/17 1403  BP: (!) 159/81 (!) 156/84  Pulse: (!) 107 92  Resp: 18   Temp: 36.7 C   SpO2: 100% 99%    Last Pain:  Vitals:   07/16/17 1000  TempSrc: Oral  PainSc:       Patients Stated Pain Goal: 2 (75/10/25 8527)  Complications: No apparent anesthesia complications

## 2017-07-17 ENCOUNTER — Other Ambulatory Visit (HOSPITAL_BASED_OUTPATIENT_CLINIC_OR_DEPARTMENT_OTHER): Payer: 59

## 2017-07-17 LAB — CBC
HEMATOCRIT: 37.4 % (ref 36.0–46.0)
HEMOGLOBIN: 12.6 g/dL (ref 12.0–15.0)
MCH: 29.6 pg (ref 26.0–34.0)
MCHC: 33.7 g/dL (ref 30.0–36.0)
MCV: 88 fL (ref 78.0–100.0)
Platelets: 358 10*3/uL (ref 150–400)
RBC: 4.25 MIL/uL (ref 3.87–5.11)
RDW: 14 % (ref 11.5–15.5)
WBC: 13.1 10*3/uL — AB (ref 4.0–10.5)

## 2017-07-17 LAB — BASIC METABOLIC PANEL
ANION GAP: 7 (ref 5–15)
BUN: 8 mg/dL (ref 6–20)
CHLORIDE: 107 mmol/L (ref 101–111)
CO2: 23 mmol/L (ref 22–32)
Calcium: 8.8 mg/dL — ABNORMAL LOW (ref 8.9–10.3)
Creatinine, Ser: 0.91 mg/dL (ref 0.44–1.00)
GFR calc non Af Amer: >60 mL/min (ref >60)
Glucose, Bld: 133 mg/dL — ABNORMAL HIGH (ref 65–99)
Potassium: 3.9 mmol/L (ref 3.5–5.1)
Sodium: 137 mmol/L (ref 135–145)

## 2017-07-17 MED ORDER — HYDROCODONE-ACETAMINOPHEN 5-325 MG PO TABS: 1.0000 | ORAL_TABLET | ORAL | 0 refills | Status: DC | PRN

## 2017-07-17 MED ORDER — ACETAMINOPHEN 500 MG PO TABS: 500.0000 mg | ORAL_TABLET | Freq: Four times a day (QID) | ORAL | 0 refills | Status: DC | PRN

## 2017-07-17 MED FILL — HYDROCODON-APAP 5-325: 5-325 | 2 days supply | Qty: 15 | Fill #0

## 2017-07-17 NOTE — Progress Notes (Signed)
Discharge instructions given. Pt verbalized understanding and all questions were answered.  

## 2017-07-17 NOTE — Discharge Summary (Signed)
[facility] Discharge Summary   Patient ID: Kaitlyn Stevens MRN: [id number] DOB/AGE: 1975/11/15 42 y.o.  Admit date: 07/15/2017 Discharge date: 07/17/2017  Admitting Diagnosis: Acute cholecystitis  Discharge Diagnosis S/P laparoscopic cholecystectomy  Consultants None  Imaging: US Abdomen Limited Ruq  Result Date: 07/15/2017 CLINICAL DATA:  Right upper quadrant pain for several weeks EXAM: ULTRASOUND ABDOMEN LIMITED RIGHT UPPER QUADRANT COMPARISON:  None. FINDINGS: Gallbladder: Mild distended with multiple gallstones and gallbladder sludge within. One of the stones appears lodged within the neck of the gallbladder. Gallbladder wall is mildly prominent. There are changes suggestive of a mildly distended cystic duct measuring 9 mm. Minimal pericholecystic fluid is noted. Common bile duct: Diameter: 4 mm. Liver: Focal area of increased echogenicity is noted in the left lobe of the liver measuring approximately 5.5 cm. This is suspicious for an underlying hemangioma. Portal vein is patent on color Doppler imaging with normal direction of blood flow towards the liver. IMPRESSION: Distended gallbladder with gallstones and gallbladder sludge as well as findings suggestive of a gallstone within the gallbladder neck and dilatation of the cystic duct. Some wall thickening and pericholecystic fluid is noted suspicious for acute cholecystitis. Focus of increased echogenicity within the left lobe of the liver suggestive of hemangioma. Follow-up ultrasound in 6 months is recommended to assess for stability. Electronically Signed   By: Inez Catalina M.D.   On: 07/15/2017 18:52    Procedures Dr. Barry Dienes (07/16/17) - Laparoscopic Cholecystectomy   Hospital Course:  Patient is a 42 year old female who presented to [facility] with RUQ pain.  Workup showed acute cholecystitis.  Patient was admitted and underwent procedure listed above.  Tolerated procedure well and was transferred to the floor.  Diet was  advanced as tolerated.  On POD#1, the patient was voiding well, tolerating diet, ambulating well, pain well controlled, vital signs stable, incisions c/d/i and felt stable for discharge home.  Patient will follow up in our office in 2 weeks and knows to call with questions or concerns. She will call to confirm appointment date/time.    Physical Exam: General:  Alert, NAD, pleasant, comfortable Abd:  Soft, ND, mild tenderness, incisions C/D/I  I have personally looked this patient up in the [place] Controlled Substance Database and reviewed their medications.   Allergies as of 07/17/2017   No Known Allergies     Medication List    STOP taking these medications   dicyclomine 20 MG tablet Commonly known as:  BENTYL   omeprazole 20 MG capsule Commonly known as:  PRILOSEC   traMADol 50 MG tablet Commonly known as:  ULTRAM     TAKE these medications   acetaminophen 500 MG tablet Commonly known as:  TYLENOL Take 1 tablet (500 mg total) by mouth every 6 (six) hours as needed for mild pain or headache.   amLODipine 5 MG tablet Commonly known as:  NORVASC Take 1 tablet (5 mg total) by mouth daily.   HYDROcodone-acetaminophen 5-325 MG tablet Commonly known as:  NORCO/VICODIN Take 1-2 tablets by mouth every 4 (four) hours as needed for moderate pain.   losartan-hydrochlorothiazide 100-25 MG tablet Commonly known as:  HYZAAR Take 1 tablet by mouth daily.   polyethylene glycol powder powder Commonly known as:  MIRALAX Take 17 g by mouth daily.        Follow-up [facility], [place]. Go on 07/30/2017.   Specialty:  General Surgery Why:  Your appointment is 07/30/17 at 1:45 pm. Please arrive 30 minutes  prior to your appointment to check in and fill out paperwork. Bring photo ID and insurance information. Contact information: [address] [place] [phone]          Signed: Brigid Re, [facility] Surgery 07/17/2017, 9:11 AM Pager: [phone] Consults: [phone] Mon-Fri 7:00 am-4:30 pm Sat-Sun 7:00 am-11:30 am

## 2017-07-20 NOTE — Anesthesia Postprocedure Evaluation (Signed)
Anesthesia Post Note  Patient: Kaitlyn Stevens  Procedure(s) Performed: LAPAROSCOPIC CHOLECYSTECTOMY (N/A )     Patient location during evaluation: PACU Anesthesia Type: General Level of consciousness: awake and alert Pain management: pain level controlled Vital Signs Assessment: post-procedure vital signs reviewed and stable Respiratory status: spontaneous breathing, nonlabored ventilation, respiratory function stable and patient connected to nasal cannula oxygen Cardiovascular status: blood pressure returned to baseline and stable Postop Assessment: no apparent nausea or vomiting Anesthetic complications: no    Last Vitals:  Vitals:   07/17/17 0123 07/17/17 0546  BP: (!) 141/70 (!) 176/90  Pulse: 81 77  Resp: 18 18  Temp: 37 C 37.2 C  SpO2: 96% 96%    Last Pain:  Vitals:   07/17/17 1002  TempSrc:   PainSc: 0-No pain                 Tiajuana Amass

## 2017-07-21 ENCOUNTER — Ambulatory Visit: Payer: 59 | Admitting: Physician Assistant

## 2017-08-24 ENCOUNTER — Encounter: Payer: Self-pay | Admitting: Family Medicine

## 2017-08-24 ENCOUNTER — Ambulatory Visit (INDEPENDENT_AMBULATORY_CARE_PROVIDER_SITE_OTHER): Payer: 59 | Admitting: Family Medicine

## 2017-08-24 VITALS — BP 130/80 | HR 69 | Temp 98.5°F | Ht 64.0 in | Wt 195.0 lb

## 2017-08-24 DIAGNOSIS — I10 Essential (primary) hypertension: Secondary | ICD-10-CM | POA: Diagnosis not present

## 2017-08-24 NOTE — Progress Notes (Signed)
Chief Complaint  Patient presents with  . Hypertension    Subjective Kaitlyn Stevens is a 42 y.o. female who presents for hypertension follow up. She does monitor home blood pressures. Blood pressures ranging from 120-130's/70-80's on average. She is compliant with medications- Norvasc 5 mg/d, Hyzaar 100-25 mg/d. Patient has these side effects of medication: none She is not adhering to a healthy diet overall. Current exercise: walking daily   Past Medical History:  Diagnosis Date  . Essential hypertension   . History of blood transfusion 1996   with c/s done in [place]  . SVD (spontaneous vaginal delivery) 1994   twins   MedicReview of Systems Cardiovascular: no chest pain Respiratory:  no shortness of breath  Exam BP 130/80 (BP Location: Left Arm, Patient Position: Sitting, Cuff Size: Large)   Pulse 69   Temp 98.5 F (36.9 C) (Oral)   Ht 5' 4"  (1.626 m)   Wt 195 lb (88.5 kg)   SpO2 96%   BMI 33.47 kg/m  General:  well developed, well nourished, in no apparent distress Skin: warm, no pallor or diaphoresis Eyes: pupils equal and round, sclera anicteric without injection Heart: RRR, no bruits, no LE edema Lungs: clear to auscultation, no accessory muscle use Psych: well oriented with normal range of affect and appropriate judgment/insight  Essential hypertension  Cont Norvasc and Hyzaar.  Counseled on diet and exercise, healthy diet handout given. Goal wt of 180 by 6 mo. Encouraged to lift weights as well. F/u in 6 mo for CPE. The patient voiced understanding and agreement to the plan.  [place], DO 08/24/17  7:54 AM

## 2017-08-24 NOTE — Progress Notes (Signed)
Pre visit review using our clinic review tool, if applicable. No additional management support is needed unless otherwise documented below in the visit note. 

## 2017-08-24 NOTE — Patient Instructions (Addendum)
Keep active.   Goal weight: 180 lbs  Healthy Eating Plan Many factors influence your heart health, including eating and exercise habits. Heart (coronary) risk increases with abnormal blood fat (lipid) levels. Heart-healthy meal planning includes limiting unhealthy fats, increasing healthy fats, and making other small dietary changes. This includes maintaining a healthy body weight to help keep lipid levels within a normal range.  WHAT IS MY PLAN?  Your health care provider recommends that you:  Drink a glass of water before meals to help with satiety.  Eat slowly.  An alternative to the water is to add Metamucil. This will help with satiety as well. It does contain calories, unlike water.  WHAT TYPES OF FAT SHOULD I CHOOSE?  Choose healthy fats more often. Choose monounsaturated and polyunsaturated fats, such as olive oil and canola oil, flaxseeds, walnuts, almonds, and seeds.  Eat more omega-3 fats. Good choices include salmon, mackerel, sardines, tuna, flaxseed oil, and ground flaxseeds. Aim to eat fish at least two times each week.  Avoid foods with partially hydrogenated oils in them. These contain trans fats. Examples of foods that contain trans fats are stick margarine, some tub margarines, cookies, crackers, and other baked goods. If you are going to avoid a fat, this is the one to avoid!  WHAT GENERAL GUIDELINES DO I NEED TO FOLLOW?  Check food labels carefully to identify foods with trans fats. Avoid these types of options when possible.  Fill one half of your plate with vegetables and green salads. Eat 4-5 servings of vegetables per day. A serving of vegetables equals 1 cup of raw leafy vegetables,  cup of raw or cooked cut-up vegetables, or  cup of vegetable juice.  Fill one fourth of your plate with whole grains. Look for the word "whole" as the first word in the ingredient list.  Fill one fourth of your plate with lean protein foods.  Eat 4-5 servings of fruit per  day. A serving of fruit equals one medium whole fruit,  cup of dried fruit,  cup of fresh, frozen, or canned fruit. Try to avoid fruits in cups/syrups as the sugar content can be high.  Eat more foods that contain soluble fiber. Examples of foods that contain this type of fiber are apples, broccoli, carrots, beans, peas, and barley. Aim to get 20-30 g of fiber per day.  Eat more home-cooked food and less restaurant, buffet, and fast food.  Limit or avoid alcohol.  Limit foods that are high in starch and sugar.  Avoid fried foods when able.  Cook foods by using methods other than frying. Baking, boiling, grilling, and broiling are all great options. Other fat-reducing suggestions include: ? Removing the skin from poultry. ? Removing all visible fats from meats. ? Skimming the fat off of stews, soups, and gravies before serving them. ? Steaming vegetables in water or broth.  Lose weight if you are overweight. Losing just 5-10% of your initial body weight can help your overall health and prevent diseases such as diabetes and heart disease.  Increase your consumption of nuts, legumes, and seeds to 4-5 servings per week. One serving of dried beans or legumes equals  cup after being cooked, one serving of nuts equals 1 ounces, and one serving of seeds equals  ounce or 1 tablespoon.  WHAT ARE GOOD FOODS CAN I EAT? Grains Grainy breads (try to find bread that is 3 g of fiber per slice or greater), oatmeal, light popcorn. Whole-grain cereals. Rice and pasta, including brown  rice and those that are made with whole wheat. Edamame pasta is a great alternative to grain pasta. It has a higher protein content. Try to avoid significant consumption of white bread, sugary cereals, or pastries/baked goods.  Vegetables All vegetables. Cooked white potatoes do not count as vegetables.  Fruits All fruits, but limit pineapple and bananas as these fruits have a higher sugar content.  Meats and Other  Protein Sources Lean, well-trimmed beef, veal, pork, and lamb. Chicken and [place] without skin. All fish and shellfish. Wild duck, rabbit, pheasant, and venison. Egg whites or low-cholesterol egg substitutes. Dried beans, peas, lentils, and tofu.Seeds and most nuts.  Dairy Low-fat or nonfat cheeses, including ricotta, string, and mozzarella. Skim or 1% milk that is liquid, powdered, or evaporated. Buttermilk that is made with low-fat milk. Nonfat or low-fat yogurt. Soy/Almond milk are good alternatives if you cannot handle dairy.  Beverages Water is the best for you. Sports drinks with less sugar are more desirable unless you are a highly active athlete.  Sweets and Desserts Sherbets and fruit ices. Honey, jam, marmalade, jelly, and syrups. Dark chocolate.  Eat all sweets and desserts in moderation.  Fats and Oils Nonhydrogenated (trans-free) margarines. Vegetable oils, including soybean, sesame, sunflower, olive, peanut, safflower, corn, canola, and cottonseed. Salad dressings or mayonnaise that are made with a vegetable oil. Limit added fats and oils that you use for cooking, baking, salads, and as spreads.  Other Cocoa powder. Coffee and tea. Most condiments.  The items listed above may not be a complete list of recommended foods or beverages. Contact your dietitian for more options.

## 2017-09-09 MED FILL — AMLODIPINE BESYLATE 5 MG TA: 5 | 30 days supply | Qty: 30 | Fill #1

## 2017-09-09 MED FILL — LOSARTAN-HCTZ 100-25 MG TAB: 100-25 | 30 days supply | Qty: 30 | Fill #1

## 2017-09-28 ENCOUNTER — Telehealth: Payer: Self-pay | Admitting: Family Medicine

## 2017-09-28 MED ORDER — LORATADINE 10 MG PO TABS: 10.0000 mg | ORAL_TABLET | Freq: Every day | ORAL | 11 refills | Status: AC

## 2017-09-28 MED ORDER — BENZONATATE 100 MG PO CAPS: 100.0000 mg | ORAL_CAPSULE | Freq: Three times a day (TID) | ORAL | 0 refills | Status: DC | PRN

## 2017-09-28 MED FILL — BENZONATATE 100 MG CAPSULE: 100 | 7 days supply | Qty: 20 | Fill #0

## 2017-09-28 NOTE — Telephone Encounter (Signed)
Notified pt and she voices understanding. 

## 2017-09-28 NOTE — Telephone Encounter (Signed)
Copied from [place] [phone]. Topic: Quick Communication - See Telephone Encounter >> Sep 28, 2017  3:49 PM Ether Griffins B wrote: CRM for notification. See Telephone encounter for: 09/28/17.  Pt is requesting tessalon pearls be called in for her to either [facility] or [id number] [place] She has started with sneezing and nose running she has been taking benadryl and the cough started yesterday. Please advise. Pt is aware that Dr. WNani Ravensis out of the office this week, and is hoping another provider could call these in. At work she talks on the phone all day and the cough is making it very difficult to do so. She is hoping someone can give her a call either way if this is able to be called in. CB# [phone]

## 2017-09-28 NOTE — Telephone Encounter (Signed)
rx sent to [facility]. I would also recommend that she add claritin 89m once daily. If symptoms worsen, if fever >101 of if not improved in 3 days please schedule office visit.

## 2017-09-28 NOTE — Telephone Encounter (Signed)
Melissa-- please advise in PCP's absence?

## 2017-10-15 MED FILL — AMLODIPINE BESYLATE 5 MG TA: 5 | 30 days supply | Qty: 30 | Fill #2

## 2017-10-15 MED FILL — LOSARTAN-HCTZ 100-25 MG TAB: 100-25 | 30 days supply | Qty: 30 | Fill #2

## 2017-11-20 MED FILL — LOSARTAN-HCTZ 100-25 MG TAB: 100-25 | 30 days supply | Qty: 30 | Fill #3

## 2017-11-20 MED FILL — AMLODIPINE BESYLATE 5 MG TA: 5 | 30 days supply | Qty: 30 | Fill #3

## 2017-12-28 MED FILL — LOSARTAN-HCTZ 100-25 MG TAB: 100-25 | 30 days supply | Qty: 30 | Fill #4

## 2017-12-28 MED FILL — AMLODIPINE BESYLATE 5 MG TA: 5 | 30 days supply | Qty: 30 | Fill #4

## 2018-02-01 MED FILL — LOSARTAN-HCTZ 100-25 MG TAB: 100-25 | 30 days supply | Qty: 30 | Fill #5

## 2018-02-01 MED FILL — AMLODIPINE BESYLATE 5 MG TA: 5 | 30 days supply | Qty: 30 | Fill #5

## 2018-03-11 ENCOUNTER — Ambulatory Visit (INDEPENDENT_AMBULATORY_CARE_PROVIDER_SITE_OTHER): Payer: 59 | Admitting: Family Medicine

## 2018-03-11 ENCOUNTER — Encounter: Payer: Self-pay | Admitting: Family Medicine

## 2018-03-11 ENCOUNTER — Other Ambulatory Visit (INDEPENDENT_AMBULATORY_CARE_PROVIDER_SITE_OTHER): Payer: 59

## 2018-03-11 VITALS — BP 120/86 | HR 66 | Temp 98.5°F | Ht 64.0 in | Wt 208.2 lb

## 2018-03-11 DIAGNOSIS — I10 Essential (primary) hypertension: Secondary | ICD-10-CM | POA: Diagnosis not present

## 2018-03-11 DIAGNOSIS — Z0000 Encounter for general adult medical examination without abnormal findings: Secondary | ICD-10-CM

## 2018-03-11 DIAGNOSIS — R739 Hyperglycemia, unspecified: Secondary | ICD-10-CM

## 2018-03-11 LAB — LIPID PANEL
Cholesterol: 134 mg/dL (ref 0–200)
HDL: 40.1 mg/dL (ref >39.00)
LDL Cholesterol: 75 mg/dL (ref 0–99)
NonHDL: 94.15
TRIGLYCERIDES: 94 mg/dL (ref 0.0–149.0)
Total CHOL/HDL Ratio: 3
VLDL: 18.8 mg/dL (ref 0.0–40.0)

## 2018-03-11 LAB — COMPREHENSIVE METABOLIC PANEL
ALBUMIN: 4.1 g/dL (ref 3.5–5.2)
ALK PHOS: 55 U/L (ref 39–117)
ALT: 15 U/L (ref 0–35)
AST: 15 U/L (ref 0–37)
BILIRUBIN TOTAL: 0.4 mg/dL (ref 0.2–1.2)
BUN: 12 mg/dL (ref 6–23)
CALCIUM: 9.2 mg/dL (ref 8.4–10.5)
CO2: 26 meq/L (ref 19–32)
CREATININE: 0.91 mg/dL (ref 0.40–1.20)
Chloride: 103 mEq/L (ref 96–112)
GFR: 86.29 mL/min (ref >60.00)
Glucose, Bld: 112 mg/dL — ABNORMAL HIGH (ref 70–99)
Potassium: 3.7 mEq/L (ref 3.5–5.1)
Sodium: 136 mEq/L (ref 135–145)
Total Protein: 7.5 g/dL (ref 6.0–8.3)

## 2018-03-11 LAB — CBC
HCT: 37.8 % (ref 36.0–46.0)
Hemoglobin: 13 g/dL (ref 12.0–15.0)
MCHC: 34.3 g/dL (ref 30.0–36.0)
MCV: 88.6 fl (ref 78.0–100.0)
PLATELETS: 327 10*3/uL (ref 150.0–400.0)
RBC: 4.27 Mil/uL (ref 3.87–5.11)
RDW: 14 % (ref 11.5–15.5)
WBC: 6.7 10*3/uL (ref 4.0–10.5)

## 2018-03-11 LAB — HEMOGLOBIN A1C: Hgb A1c MFr Bld: 6.6 % — ABNORMAL HIGH (ref 4.6–6.5)

## 2018-03-11 MED ORDER — LOSARTAN POTASSIUM-HCTZ 100-25 MG PO TABS: 1.0000 | ORAL_TABLET | Freq: Every day | ORAL | 3 refills | Status: DC

## 2018-03-11 MED ORDER — AMLODIPINE BESYLATE 5 MG PO TABS: 5.0000 mg | ORAL_TABLET | Freq: Every day | ORAL | 3 refills | Status: DC

## 2018-03-11 MED FILL — LOSARTAN-HCTZ 100-25 MG TAB: 100-25 | 30 days supply | Qty: 30 | Fill #0

## 2018-03-11 MED FILL — AMLODIPINE BESYLATE 5 MG TA: 5 | 30 days supply | Qty: 30 | Fill #0

## 2018-03-11 NOTE — Progress Notes (Signed)
Chief Complaint  Patient presents with  . Annual Exam     Well Woman Kaitlyn Stevens is here for a complete physical.   Her last physical was >1 year ago.  Current diet: in general, an OK diet. Current exercise: walking. Weight is stable and she denies daytime fatigue. No LMP recorded. Seatbelt? Yes  Health Maintenance Pap/HPV- Yes Mammogram- Due Tetanus- Yes HIV screening- Yes  Past Medical History:  Diagnosis Date  . Essential hypertension   . History of blood transfusion 1996   with c/s done in [place]  . SVD (spontaneous vaginal delivery) 1994   twins     Past Surgical History:  Procedure Laterality Date  . CESAREAN SECTION  1996   x 1 twins  . CHOLECYSTECTOMY N/A 07/16/2017   Procedure: LAPAROSCOPIC CHOLECYSTECTOMY;  Surgeon: Stark Klein, MD;  Location: [facility];  Service: General;  Laterality: N/A;  . POLYPECTOMY  02/13/2012   Procedure: POLYPECTOMY;  Surgeon: Sharene Butters, MD;  Location: [facility];  Service: Gynecology;  Laterality: N/A;  . TUBAL LIGATION      Medications  Current Outpatient Medications on File Prior to Visit  Medication Sig Dispense Refill  . acetaminophen (TYLENOL) 500 MG tablet Take 1 tablet (500 mg total) by mouth every 6 (six) hours as needed for mild pain or headache. 30 tablet 0  . amLODipine (NORVASC) 5 MG tablet Take 1 tablet (5 mg total) by mouth daily. 90 tablet 1  . loratadine (CLARITIN) 10 MG tablet Take 1 tablet (10 mg total) by mouth daily. 30 tablet 11  . losartan-hydrochlorothiazide (HYZAAR) 100-25 MG tablet Take 1 tablet by mouth daily. 90 tablet 1   Allergies No Known Allergies  Review of Systems: Constitutional:  no unexpected weight changes Eye:  no recent significant change in vision Ear/Nose/Mouth/Throat:  Ears:  no tinnitus or vertigo and no recent change in hearing Nose/Mouth/Throat:  no complaints of nasal congestion, no sore throat Cardiovascular: no chest pain Respiratory:  no cough and no shortness of  breath Gastrointestinal:  +bloating and constipation GU:  Female: negative for dysuria or pelvic pain Musculoskeletal/Extremities:  no pain of the joints Integumentary (Skin/Breast):  no abnormal skin lesions reported Neurologic:  no headaches Endocrine:  denies fatigue Hematologic/Lymphatic:  No areas of easy bleeding  Exam BP 120/86 (BP Location: Left Arm, Patient Position: Sitting, Cuff Size: Normal)   Pulse 66   Temp 98.5 F (36.9 C) (Oral)   Ht 5' 4"  (1.626 m)   Wt 208 lb 4 oz (94.5 kg)   SpO2 95%   BMI 35.75 kg/m  General:  well developed, well nourished, in no apparent distress Skin:  no significant moles, warts, or growths Head:  no masses, lesions, or tenderness Eyes:  pupils equal and round, sclera anicteric without injection Ears:  canals without lesions, TMs shiny without retraction, no obvious effusion, no erythema Nose:  nares patent, septum midline, mucosa normal, and no drainage or sinus tenderness Throat/Pharynx:  lips and gingiva without lesion; tongue and uvula midline; non-inflamed pharynx; no exudates or postnasal drainage Neck: neck supple without adenopathy, thyromegaly, or masses Lungs:  clear to auscultation, breath sounds equal bilaterally, no respiratory distress Cardio:  regular rate and rhythm, no bruits, no LE edema Abdomen:  abdomen soft, nontender; mild distension, bowel sounds hypoactive; no masses or organomegaly Genital: Defer to GYN Musculoskeletal:  symmetrical muscle groups noted without atrophy or deformity Extremities:  no clubbing, cyanosis, or edema, no deformities, no skin discoloration Neuro:  gait normal; deep tendon  reflexes normal and symmetric Psych: well oriented with normal range of affect and appropriate judgment/insight  Assessment and Plan  Well adult exam - Plan: CBC, Comprehensive metabolic panel, Lipid panel  Essential hypertension - Plan: amLODipine (NORVASC) 5 MG tablet, losartan-hydrochlorothiazide (HYZAAR) 100-25 MG  tablet   Well 42 y.o. female. Counseled on diet and exercise. Lift weights. Start back on Miralax.  Other orders as above. Follow up in 6 mo for med ck. The patient voiced understanding and agreement to the plan.  [place], DO 03/11/18 8:02 AM

## 2018-03-11 NOTE — Progress Notes (Signed)
Pre visit review using our clinic review tool, if applicable. No additional management support is needed unless otherwise documented below in the visit note. 

## 2018-03-11 NOTE — Patient Instructions (Addendum)
Give [place] 2-3 business days to get the results of your labs back.   Keep the diet clean.  Aim to do some physical exertion for 150 minutes per week. This is typically divided into 5 days per week, 30 minutes per day. The activity should be enough to get your heart rate up. Anything is better than nothing if you have time constraints. Please consider lifting weights.   Start back on Miralax.   Let me know if you change your mind about the flu shot.  .Let us know if you need anything.

## 2018-03-12 ENCOUNTER — Telehealth: Payer: Self-pay | Admitting: Family Medicine

## 2018-03-12 NOTE — Telephone Encounter (Signed)
Pt returned call.  Copied from [place] [phone]. Topic: Quick Communication - Lab Results (Clinic Use ONLY) >> Mar 12, 2018  7:53 AM E[place], Donell Sievert, CMA wrote: Called patient to inform them of 03/12/2018 lab results. When patient returns call, triage nurse may disclose results.

## 2018-03-12 NOTE — Telephone Encounter (Signed)
Patient notified and scheduled 

## 2018-03-15 ENCOUNTER — Ambulatory Visit (INDEPENDENT_AMBULATORY_CARE_PROVIDER_SITE_OTHER): Payer: 59 | Admitting: Family Medicine

## 2018-03-15 ENCOUNTER — Telehealth: Payer: Self-pay

## 2018-03-15 ENCOUNTER — Encounter: Payer: Self-pay | Admitting: Family Medicine

## 2018-03-15 VITALS — BP 134/84 | HR 67 | Temp 98.3°F | Ht 64.0 in | Wt 207.2 lb

## 2018-03-15 DIAGNOSIS — E1169 Type 2 diabetes mellitus with other specified complication: Secondary | ICD-10-CM

## 2018-03-15 DIAGNOSIS — Z23 Encounter for immunization: Secondary | ICD-10-CM | POA: Diagnosis not present

## 2018-03-15 DIAGNOSIS — E669 Obesity, unspecified: Secondary | ICD-10-CM | POA: Diagnosis not present

## 2018-03-15 DIAGNOSIS — E119 Type 2 diabetes mellitus without complications: Secondary | ICD-10-CM | POA: Insufficient documentation

## 2018-03-15 LAB — MICROALBUMIN / CREATININE URINE RATIO
Creatinine,U: 202.1 mg/dL
MICROALB/CREAT RATIO: 1 mg/g (ref 0.0–30.0)
Microalb, Ur: 2.1 mg/dL — ABNORMAL HIGH (ref 0.0–1.9)

## 2018-03-15 MED ORDER — ONETOUCH VERIO IQ SYSTEM W/DEVICE KIT: PACK | 0 refills | Status: DC

## 2018-03-15 MED ORDER — ONETOUCH LANCETS MISC: 3 refills | Status: DC

## 2018-03-15 MED ORDER — ATORVASTATIN CALCIUM 40 MG PO TABS: 40.0000 mg | ORAL_TABLET | Freq: Every day | ORAL | 3 refills | Status: DC

## 2018-03-15 MED ORDER — GLUCOSE BLOOD VI STRP: ORAL_STRIP | 3 refills | Status: DC

## 2018-03-15 MED FILL — ATORVASTATIN 40 MG TABLET: 40 | 30 days supply | Qty: 30 | Fill #0

## 2018-03-15 NOTE — Telephone Encounter (Signed)
Removed from list///called the patient to inform to call her insurance co. To find out what they cover.

## 2018-03-15 NOTE — Progress Notes (Signed)
Pre visit review using our clinic review tool, if applicable. No additional management support is needed unless otherwise documented below in the visit note. 

## 2018-03-15 NOTE — Telephone Encounter (Signed)
Received PA request for One Touch Verio IQ. Insurance doesn't cover One Touch products. Unsure which product they do cover- recommend to reach out to pharmacy- cancelling PA.

## 2018-03-15 NOTE — Progress Notes (Signed)
Chief Complaint  Patient presents with  . Results    Subjective: Patient is a 42 y.o. female here for lab f.u.  Hyperglycemia on recent labs. A1c f/u was 6.6. Diet is poor.  She plans to start exercising routinely.  Mother has diabetes and hypertension.  Patient has never been diagnosed with diabetes before.  She does not routinely follow with an eye doctor.  She has never had a pneumonia vaccine and is not currently on a statin.  ROS: Heart: Denies chest pain  Lungs: Denies SOB   Past Medical History:  Diagnosis Date  . Essential hypertension   . History of blood transfusion 1996   with c/s done in [place]  . SVD (spontaneous vaginal delivery) 1994   twins    Objective: BP 134/84 (BP Location: Right Arm, Patient Position: Sitting, Cuff Size: Normal)   Pulse 67   Temp 98.3 F (36.8 C) (Oral)   Ht 5' 4"  (1.626 m)   Wt 207 lb 4 oz (94 kg)   SpO2 97%   BMI 35.57 kg/m  General: Awake, appears stated age HEENT: MMM, EOMi Heart: RRR, no murmurs Lungs: CTAB, no rales, wheezes or rhonchi. No accessory muscle use Neuro: Sensation intact to pinprick bilateral feet. Skin: No ulcers or callus formation on the feet Psych: Age appropriate judgment and insight, normal affect and mood  Assessment and Plan: Diabetes mellitus type 2 in obese ([place]) - Plan: Microalbumin / creatinine urine ratio, Ambulatory referral to Ophthalmology, atorvastatin (LIPITOR) 40 MG tablet, ONE TOUCH LANCETS MISC, glucose blood (ONETOUCH VERIO) test strip, Blood Glucose Monitoring Suppl (ONETOUCH VERIO IQ SYSTEM) w/Device KIT, HM DIABETES FOOT EXAM  Need for vaccination against Streptococcus pneumoniae - Plan: Pneumococcal polysaccharide vaccine 23-valent greater than or equal to 2yo subcutaneous/IM  Orders as above. Can hold off on insulin or oral medications at this point. Start statin, refer to ophthalmology, check microalbumin creatinine ratio, glucometer and materials called in, counseled on diet/exercise,  foot exam unremarkable. We will follow-up in 3 months unless she needs [place]. The patient voiced understanding and agreement to the plan.  [place], DO 03/15/18  9:29 AM

## 2018-03-15 NOTE — Patient Instructions (Addendum)
If you do not hear anything about your referral in the next 1-2 weeks, call our office and ask for an update.  Check your blood pressures at home. Bring readings to next appt.   Give [place] 2-3 business days to get the results of your labs back.   Aim to do some physical exertion for 150 minutes per week. This is typically divided into 5 days per week, 30 minutes per day. The activity should be enough to get your heart rate up. Anything is better than nothing if you have time constraints.  Let us know if you need anything.  Healthy Eating Plan Many factors influence your heart health, including eating and exercise habits. Heart (coronary) risk increases with abnormal blood fat (lipid) levels. Heart-healthy meal planning includes limiting unhealthy fats, increasing healthy fats, and making other small dietary changes. This includes maintaining a healthy body weight to help keep lipid levels within a normal range.  WHAT IS MY PLAN?  Your health care provider recommends that you:  Drink a glass of water before meals to help with satiety.  Eat slowly.  An alternative to the water is to add Metamucil. This will help with satiety as well. It does contain calories, unlike water.  WHAT TYPES OF FAT SHOULD I CHOOSE?  Choose healthy fats more often. Choose monounsaturated and polyunsaturated fats, such as olive oil and canola oil, flaxseeds, walnuts, almonds, and seeds.  Eat more omega-3 fats. Good choices include salmon, mackerel, sardines, tuna, flaxseed oil, and ground flaxseeds. Aim to eat fish at least two times each week.  Avoid foods with partially hydrogenated oils in them. These contain trans fats. Examples of foods that contain trans fats are stick margarine, some tub margarines, cookies, crackers, and other baked goods. If you are going to avoid a fat, this is the one to avoid!  WHAT GENERAL GUIDELINES DO I NEED TO FOLLOW?  Check food labels carefully to identify foods with trans fats.  Avoid these types of options when possible.  Fill one half of your plate with vegetables and green salads. Eat 4-5 servings of vegetables per day. A serving of vegetables equals 1 cup of raw leafy vegetables,  cup of raw or cooked cut-up vegetables, or  cup of vegetable juice.  Fill one fourth of your plate with whole grains. Look for the word "whole" as the first word in the ingredient list.  Fill one fourth of your plate with lean protein foods.  Eat 4-5 servings of fruit per day. A serving of fruit equals one medium whole fruit,  cup of dried fruit,  cup of fresh, frozen, or canned fruit. Try to avoid fruits in cups/syrups as the sugar content can be high.  Eat more foods that contain soluble fiber. Examples of foods that contain this type of fiber are apples, broccoli, carrots, beans, peas, and barley. Aim to get 20-30 g of fiber per day.  Eat more home-cooked food and less restaurant, buffet, and fast food.  Limit or avoid alcohol.  Limit foods that are high in starch and sugar.  Avoid fried foods when able.  Cook foods by using methods other than frying. Baking, boiling, grilling, and broiling are all great options. Other fat-reducing suggestions include: ? Removing the skin from poultry. ? Removing all visible fats from meats. ? Skimming the fat off of stews, soups, and gravies before serving them. ? Steaming vegetables in water or broth.  Lose weight if you are overweight. Losing just 5-10% of your initial  body weight can help your overall health and prevent diseases such as diabetes and heart disease.  Increase your consumption of nuts, legumes, and seeds to 4-5 servings per week. One serving of dried beans or legumes equals  cup after being cooked, one serving of nuts equals 1 ounces, and one serving of seeds equals  ounce or 1 tablespoon.  WHAT ARE GOOD FOODS CAN I EAT? Grains Grainy breads (try to find bread that is 3 g of fiber per slice or greater), oatmeal, light  popcorn. Whole-grain cereals. Rice and pasta, including brown rice and those that are made with whole wheat. Edamame pasta is a great alternative to grain pasta. It has a higher protein content. Try to avoid significant consumption of white bread, sugary cereals, or pastries/baked goods.  Vegetables All vegetables. Cooked white potatoes do not count as vegetables.  Fruits All fruits, but limit pineapple and bananas as these fruits have a higher sugar content.  Meats and Other Protein Sources Lean, well-trimmed beef, veal, pork, and lamb. Chicken and [place] without skin. All fish and shellfish. Wild duck, rabbit, pheasant, and venison. Egg whites or low-cholesterol egg substitutes. Dried beans, peas, lentils, and tofu.Seeds and most nuts.  Dairy Low-fat or nonfat cheeses, including ricotta, string, and mozzarella. Skim or 1% milk that is liquid, powdered, or evaporated. Buttermilk that is made with low-fat milk. Nonfat or low-fat yogurt. Soy/Almond milk are good alternatives if you cannot handle dairy.  Beverages Water is the best for you. Sports drinks with less sugar are more desirable unless you are a highly active athlete.  Sweets and Desserts Sherbets and fruit ices. Honey, jam, marmalade, jelly, and syrups. Dark chocolate.  Eat all sweets and desserts in moderation.  Fats and Oils Nonhydrogenated (trans-free) margarines. Vegetable oils, including soybean, sesame, sunflower, olive, peanut, safflower, corn, canola, and cottonseed. Salad dressings or mayonnaise that are made with a vegetable oil. Limit added fats and oils that you use for cooking, baking, salads, and as spreads.  Other Cocoa powder. Coffee and tea. Most condiments.  The items listed above may not be a complete list of recommended foods or beverages. Contact your dietitian for more options.

## 2018-03-24 ENCOUNTER — Encounter: Payer: Self-pay | Admitting: Family Medicine

## 2018-03-24 LAB — HM DIABETES EYE EXAM

## 2018-04-29 MED FILL — HYDROCHLOROTHIAZIDE 25 MG T: 25 | 30 days supply | Qty: 30 | Fill #0

## 2018-04-29 MED FILL — ATORVASTATIN 40 MG TABLET: 40 | 30 days supply | Qty: 30 | Fill #1

## 2018-04-29 MED FILL — AMLODIPINE BESYLATE 5 MG TA: 5 | 30 days supply | Qty: 30 | Fill #1

## 2018-04-29 MED FILL — LOSARTAN POTASSIUM 100 MG T: 100 | 30 days supply | Qty: 30 | Fill #0

## 2018-06-14 ENCOUNTER — Other Ambulatory Visit: Payer: Self-pay | Admitting: Family Medicine

## 2018-06-14 DIAGNOSIS — E1169 Type 2 diabetes mellitus with other specified complication: Secondary | ICD-10-CM

## 2018-06-14 DIAGNOSIS — E669 Obesity, unspecified: Principal | ICD-10-CM

## 2018-06-14 DIAGNOSIS — I10 Essential (primary) hypertension: Secondary | ICD-10-CM

## 2018-06-14 MED ORDER — ATORVASTATIN CALCIUM 40 MG PO TABS: 40.0000 mg | ORAL_TABLET | Freq: Every day | ORAL | 2 refills | Status: DC

## 2018-06-14 MED ORDER — AMLODIPINE BESYLATE 5 MG PO TABS: 5.0000 mg | ORAL_TABLET | Freq: Every day | ORAL | 1 refills | Status: DC

## 2018-06-14 MED ORDER — LOSARTAN POTASSIUM-HCTZ 100-25 MG PO TABS: 1.0000 | ORAL_TABLET | Freq: Every day | ORAL | 1 refills | Status: DC

## 2018-06-14 NOTE — Telephone Encounter (Signed)
Copied from [place] [id number]. Topic: Quick Communication - Rx Refill/Question >> Jun 14, 2018 10:53 AM Alanda Slim E wrote: Medication: losartan-hydrochlorothiazide (HYZAAR) 100-25 MG tablet   amLODipine (NORVASC) 5 MG tablet   atorvastatin (LIPITOR) 40 MG tablet  Has the patient contacted their pharmacy? no   Preferred Pharmacy (with phone number or street name): [id number] [phone](Phone) [phone](Fax)    Agent: Please be advised that RX refills may take up to 3 business days. We ask that you follow-up with your pharmacy.

## 2018-06-16 ENCOUNTER — Ambulatory Visit (INDEPENDENT_AMBULATORY_CARE_PROVIDER_SITE_OTHER): Payer: 59 | Admitting: Family Medicine

## 2018-06-16 ENCOUNTER — Encounter: Payer: Self-pay | Admitting: Family Medicine

## 2018-06-16 VITALS — BP 122/80 | HR 71 | Temp 98.1°F | Ht 64.0 in | Wt 210.5 lb

## 2018-06-16 DIAGNOSIS — E669 Obesity, unspecified: Secondary | ICD-10-CM

## 2018-06-16 DIAGNOSIS — E1169 Type 2 diabetes mellitus with other specified complication: Secondary | ICD-10-CM

## 2018-06-16 DIAGNOSIS — I10 Essential (primary) hypertension: Secondary | ICD-10-CM | POA: Diagnosis not present

## 2018-06-16 LAB — HEMOGLOBIN A1C: Hgb A1c MFr Bld: 6.6 % — ABNORMAL HIGH (ref 4.6–6.5)

## 2018-06-16 LAB — GLUCOSE, POCT (MANUAL RESULT ENTRY): POC Glucose: 133 mg/dl — AB (ref 70–99)

## 2018-06-16 MED ORDER — AMLODIPINE BESYLATE 5 MG PO TABS: 5.0000 mg | ORAL_TABLET | Freq: Every day | ORAL | 2 refills | Status: DC

## 2018-06-16 MED ORDER — ATORVASTATIN CALCIUM 40 MG PO TABS: 40.0000 mg | ORAL_TABLET | Freq: Every day | ORAL | 3 refills | Status: DC

## 2018-06-16 MED ORDER — LOSARTAN POTASSIUM-HCTZ 100-25 MG PO TABS: 1.0000 | ORAL_TABLET | Freq: Every day | ORAL | 2 refills | Status: DC

## 2018-06-16 NOTE — Addendum Note (Signed)
Addended by: Sharon Seller B on: 06/16/2018 08:14 AM   Modules accepted: Orders

## 2018-06-16 NOTE — Patient Instructions (Signed)
Keep the diet clean and stay active.  Give [place] 2-3 business days to get the results of your labs back.   Because your blood pressure is well-controlled, you no longer have to check your blood pressure at home anymore unless you wish. Some people check it twice daily every day and some people stop altogether. Either or anything in between is fine. Strong work!  Let us know if you need anything.

## 2018-06-16 NOTE — Progress Notes (Signed)
Subjective:   Chief Complaint  Patient presents with  . Follow-up    Kaitlyn Stevens is a 43 y.o. female here for follow-up of diabetes.   Kaitlyn Stevens has not been checking sugars.  Patient does not require insulin.   Medications include: Diet controlled Diet is fair. Exercise: none  Hypertension Patient presents for hypertension follow up. She does monitor home blood pressures. Blood pressures ranging on average from 120's/80's. She is compliant with medications- Norvasc 5 mg/d, Hyzaar 100-25 mg/d. Patient has these side effects of medication: none She is usually adhering to a healthy diet overall. Exercise: none   Past Medical History:  Diagnosis Date  . Essential hypertension   . History of blood transfusion 1996   with c/s done in [place]  . SVD (spontaneous vaginal delivery) 1994   twins     Related testing: Date of retinal exam: Done Pneumovax: done Flu Shot: done  Review of Systems: Pulmonary:  No SOB Cardiovascular:  No chest pain  Objective:  BP 122/80 (BP Location: Left Arm, Patient Position: Sitting, Cuff Size: Large)   Pulse 71   Temp 98.1 F (36.7 C) (Oral)   Ht 5' 4"  (1.626 m)   Wt 210 lb 8 oz (95.5 kg)   SpO2 98%   BMI 36.13 kg/m  General:  Well developed, well nourished, in no apparent distress Skin:  Warm, no pallor or diaphoresis Head:  Normocephalic, atraumatic Eyes:  Pupils equal and round, sclera anicteric without injection  Lungs:  CTAB, no access msc use Cardio:  RRR, no bruits, no LE edema Psych: Age appropriate judgment and insight  Assessment:   Diabetes mellitus type 2 in obese (HCC) - Plan: atorvastatin (LIPITOR) 40 MG tablet, Hemoglobin A1c  Essential hypertension - Plan: losartan-hydrochlorothiazide (HYZAAR) 100-25 MG tablet, amLODipine (NORVASC) 5 MG tablet   Plan:   Orders as above. Counseled on diet and exercise. Needs to start exercising.  OK to stop checking BP at home.  Education for glucometer provided today, states she  can get a free one through work. Will let us know if she needs anything specific from [place].  F/u in 6 mo. The patient voiced understanding and agreement to the plan.  [place], DO 06/16/18 7:56 AM

## 2018-09-14 ENCOUNTER — Ambulatory Visit (INDEPENDENT_AMBULATORY_CARE_PROVIDER_SITE_OTHER): Payer: 59 | Admitting: Family Medicine

## 2018-09-14 ENCOUNTER — Encounter: Payer: Self-pay | Admitting: Family Medicine

## 2018-09-14 ENCOUNTER — Other Ambulatory Visit: Payer: Self-pay

## 2018-09-14 DIAGNOSIS — E669 Obesity, unspecified: Secondary | ICD-10-CM | POA: Diagnosis not present

## 2018-09-14 DIAGNOSIS — I10 Essential (primary) hypertension: Secondary | ICD-10-CM | POA: Diagnosis not present

## 2018-09-14 DIAGNOSIS — E1169 Type 2 diabetes mellitus with other specified complication: Secondary | ICD-10-CM | POA: Diagnosis not present

## 2018-09-14 NOTE — Progress Notes (Signed)
Chief Complaint  Patient presents with  . Follow-up    6 month    Subjective Kaitlyn Stevens is a 43 y.o. female who presents for hypertension follow up. Due to COVID-19 pandemic, we are interacting via web portal for an electronic face-to-face visit. I verified patient's ID using 2 identifiers. Patient agreed to proceed with visit via this method. Patient is at home, I am at home. Patient and I are present for visit.   She does monitor home blood pressures. Blood pressures ranging from 120-130's/70's on average. She is compliant with medications- Hyzaar 100-25 mg/d, Norvasc 5 mg/d. Patient has these side effects of medication: none She is not adhering to a healthy diet overall. Current exercise: some walking  DM II- diet controlled. Does not check sugars. Needs to find out which glucometer ins will cover. Diet/exercise as above.    Past Medical History:  Diagnosis Date  . Essential hypertension   . History of blood transfusion 1996   with c/s done in [place]  . SVD (spontaneous vaginal delivery) 1994   twins    Review of Systems Cardiovascular: no chest pain Respiratory:  no shortness of breath  Exam No conversational dyspnea Age appropriate judgment and insight Nml affect and mood  Diabetes mellitus type 2 in obese [facility])  Essential hypertension  Find out which glucometer ins covers and let us know so we can send in. Counseled on diet and exercise. F/u as originally scheduled. The patient voiced understanding and agreement to the plan.  [place], DO 09/14/18  1:36 PM

## 2018-09-16 ENCOUNTER — Ambulatory Visit: Payer: 59 | Admitting: Family Medicine

## 2018-10-07 LAB — HM DIABETES EYE EXAM

## 2018-10-08 ENCOUNTER — Encounter: Payer: Self-pay | Admitting: Family Medicine

## 2019-08-09 ENCOUNTER — Other Ambulatory Visit: Payer: Self-pay

## 2019-08-10 ENCOUNTER — Other Ambulatory Visit: Payer: Self-pay | Admitting: Family Medicine

## 2019-08-10 ENCOUNTER — Ambulatory Visit (INDEPENDENT_AMBULATORY_CARE_PROVIDER_SITE_OTHER): Payer: 59 | Admitting: Family Medicine

## 2019-08-10 ENCOUNTER — Encounter: Payer: Self-pay | Admitting: Family Medicine

## 2019-08-10 ENCOUNTER — Other Ambulatory Visit: Payer: Self-pay

## 2019-08-10 VITALS — BP 112/74 | HR 72 | Temp 96.9°F | Ht 64.0 in | Wt 211.5 lb

## 2019-08-10 DIAGNOSIS — E1169 Type 2 diabetes mellitus with other specified complication: Secondary | ICD-10-CM | POA: Diagnosis not present

## 2019-08-10 DIAGNOSIS — Z0000 Encounter for general adult medical examination without abnormal findings: Secondary | ICD-10-CM

## 2019-08-10 DIAGNOSIS — R7989 Other specified abnormal findings of blood chemistry: Secondary | ICD-10-CM

## 2019-08-10 DIAGNOSIS — I10 Essential (primary) hypertension: Secondary | ICD-10-CM | POA: Diagnosis not present

## 2019-08-10 DIAGNOSIS — E669 Obesity, unspecified: Secondary | ICD-10-CM | POA: Diagnosis not present

## 2019-08-10 LAB — COMPREHENSIVE METABOLIC PANEL
ALT: 17 U/L (ref 0–35)
AST: 17 U/L (ref 0–37)
Albumin: 4 g/dL (ref 3.5–5.2)
Alkaline Phosphatase: 74 U/L (ref 39–117)
BUN: 12 mg/dL (ref 6–23)
CO2: 29 mEq/L (ref 19–32)
Calcium: 9.2 mg/dL (ref 8.4–10.5)
Chloride: 100 mEq/L (ref 96–112)
Creatinine, Ser: 0.91 mg/dL (ref 0.40–1.20)
GFR: 80.67 mL/min (ref >60.00)
Glucose, Bld: 133 mg/dL — ABNORMAL HIGH (ref 70–99)
Potassium: 3.9 mEq/L (ref 3.5–5.1)
Sodium: 137 mEq/L (ref 135–145)
Total Bilirubin: 0.4 mg/dL (ref 0.2–1.2)
Total Protein: 7.5 g/dL (ref 6.0–8.3)

## 2019-08-10 LAB — HEMOGLOBIN A1C: Hgb A1c MFr Bld: 7 % — ABNORMAL HIGH (ref 4.6–6.5)

## 2019-08-10 LAB — LIPID PANEL
Cholesterol: 88 mg/dL (ref 0–200)
HDL: 34.4 mg/dL — ABNORMAL LOW (ref >39.00)
LDL Cholesterol: 37 mg/dL (ref 0–99)
NonHDL: 53.53
Total CHOL/HDL Ratio: 3
Triglycerides: 85 mg/dL (ref 0.0–149.0)
VLDL: 17 mg/dL (ref 0.0–40.0)

## 2019-08-10 LAB — CBC
HCT: 34.2 % — ABNORMAL LOW (ref 36.0–46.0)
Hemoglobin: 11.8 g/dL — ABNORMAL LOW (ref 12.0–15.0)
MCHC: 34.5 g/dL (ref 30.0–36.0)
MCV: 88.2 fl (ref 78.0–100.0)
Platelets: 305 10*3/uL (ref 150.0–400.0)
RBC: 3.88 Mil/uL (ref 3.87–5.11)
RDW: 14.6 % (ref 11.5–15.5)
WBC: 7.4 10*3/uL (ref 4.0–10.5)

## 2019-08-10 LAB — MICROALBUMIN / CREATININE URINE RATIO
Creatinine,U: 255.6 mg/dL
Microalb Creat Ratio: 0.7 mg/g (ref 0.0–30.0)
Microalb, Ur: 1.8 mg/dL (ref 0.0–1.9)

## 2019-08-10 MED ORDER — LOSARTAN POTASSIUM-HCTZ 100-25 MG PO TABS: 1.0000 | ORAL_TABLET | Freq: Every day | ORAL | 2 refills | Status: DC

## 2019-08-10 MED ORDER — ATORVASTATIN CALCIUM 40 MG PO TABS: 40.0000 mg | ORAL_TABLET | Freq: Every day | ORAL | 3 refills | Status: DC

## 2019-08-10 MED ORDER — AMLODIPINE BESYLATE 5 MG PO TABS: 5.0000 mg | ORAL_TABLET | Freq: Every day | ORAL | 2 refills | Status: DC

## 2019-08-10 NOTE — Patient Instructions (Signed)
Give us 2-3 business days to get the results of your labs back.   Keep the diet clean and stay active.  Aim to do some physical exertion for 150 minutes per week. This is typically divided into 5 days per week, 30 minutes per day. The activity should be enough to get your heart rate up. Anything is better than nothing if you have time constraints.  Let us know if you need anything.  

## 2019-08-10 NOTE — Progress Notes (Signed)
Chief Complaint  Patient presents with  . Annual Exam     Well Woman Kaitlyn Stevens is here for a complete physical.   Her last physical was >1 year ago.  Current diet: in general, diet has been better. Current exercise: sometimes will walk. Weight is stable and she denies daytime fatigue. Seatbelt? Yes  Health Maintenance Pap/HPV- Yes Mammogram- Scheduled Tetanus- Yes HIV screening- Yes  Past Medical History:  Diagnosis Date  . Essential hypertension   . History of blood transfusion 1996   with c/s done in [place]  . SVD (spontaneous vaginal delivery) 1994   twins    Past Surgical History:  Procedure Laterality Date  . CESAREAN SECTION  1996   x 1 twins  . CHOLECYSTECTOMY N/A 07/16/2017   Procedure: LAPAROSCOPIC CHOLECYSTECTOMY;  Surgeon: Stark Klein, MD;  Location: [facility];  Service: General;  Laterality: N/A;  . POLYPECTOMY  02/13/2012   Procedure: POLYPECTOMY;  Surgeon: Sharene Butters, MD;  Location: [facility];  Service: Gynecology;  Laterality: N/A;  . TUBAL LIGATION      Medications  Current Outpatient Medications on File Prior to Visit  Medication Sig Dispense Refill  . acetaminophen (TYLENOL) 500 MG tablet Take 1 tablet (500 mg total) by mouth every 6 (six) hours as needed for mild pain or headache. 30 tablet 0  . amLODipine (NORVASC) 5 MG tablet Take 1 tablet (5 mg total) by mouth daily. 90 tablet 2  . atorvastatin (LIPITOR) 40 MG tablet Take 1 tablet (40 mg total) by mouth daily. 90 tablet 3  . loratadine (CLARITIN) 10 MG tablet Take 1 tablet (10 mg total) by mouth daily. 30 tablet 11  . losartan-hydrochlorothiazide (HYZAAR) 100-25 MG tablet Take 1 tablet by mouth daily. 90 tablet 2  . polyethylene glycol powder (MIRALAX) powder Take 17 g by mouth daily. 255 g 0   Allergies No Known Allergies  Review of Systems: Constitutional:  no unexpected weight changes Eye:  no recent significant change in vision Ear/Nose/Mouth/Throat:  Ears:  no recent change in  hearing Nose/Mouth/Throat:  no complaints of nasal congestion, no sore throat Cardiovascular: no chest pain Respiratory:  no shortness of breath Gastrointestinal:  no abdominal pain, no change in bowel habits; +hemorrhoids GU:  Female: negative for dysuria or pelvic pain Musculoskeletal/Extremities:  no pain of the joints Integumentary (Skin/Breast):  no abnormal skin lesions reported Neurologic:  no headaches Endocrine:  denies fatigue Hematologic/Lymphatic:  No areas of easy bleeding  Exam BP 112/74 (BP Location: Left Arm, Patient Position: Sitting, Cuff Size: Normal)   Pulse 72   Temp (!) 96.9 F (36.1 C) (Temporal)   Ht 5' 4"  (1.626 m)   Wt 211 lb 8 oz (95.9 kg)   SpO2 98%   BMI 36.30 kg/m  General:  well developed, well nourished, in no apparent distress Skin:  no significant moles, warts, or growths Head:  no masses, lesions, or tenderness Eyes:  pupils equal and round, sclera anicteric without injection Ears:  canals without lesions, TMs shiny without retraction, no obvious effusion, no erythema Nose:  nares patent, septum midline, mucosa normal, and no drainage or sinus tenderness Throat/Pharynx:  lips and gingiva without lesion; tongue and uvula midline; non-inflamed pharynx; no exudates or postnasal drainage Neck: neck supple without adenopathy, thyromegaly, or masses Lungs:  clear to auscultation, breath sounds equal bilaterally, no respiratory distress Cardio:  regular rate and rhythm, no LE edema Abdomen:  abdomen soft, nontender; bowel sounds normal; no masses or organomegaly Genital: Defer to  GYN Musculoskeletal:  symmetrical muscle groups noted without atrophy or deformity Extremities:  no clubbing, cyanosis, or edema, no deformities, no skin discoloration Neuro:  gait normal; deep tendon reflexes normal and symmetric Psych: well oriented with normal range of affect and appropriate judgment/insight  Assessment and Plan  Well adult exam - Plan: CBC,  Comprehensive metabolic panel, Lipid panel  Diabetes mellitus type 2 in obese ([place]) - Plan: Hemoglobin A1c, Microalbumin / creatinine urine ratio   Well 44 y.o. female. Counseled on diet and exercise. Pt has had tubal ligation.  Other orders as above. Follow up in 6 mo pending labs. The patient voiced understanding and agreement to the plan.  [place], DO 08/10/19 7:06 AM

## 2019-09-09 ENCOUNTER — Other Ambulatory Visit: Payer: Self-pay

## 2019-09-09 ENCOUNTER — Other Ambulatory Visit (INDEPENDENT_AMBULATORY_CARE_PROVIDER_SITE_OTHER): Payer: 59

## 2019-09-09 DIAGNOSIS — R7989 Other specified abnormal findings of blood chemistry: Secondary | ICD-10-CM | POA: Diagnosis not present

## 2019-09-09 LAB — CBC
HCT: 35.7 % — ABNORMAL LOW (ref 36.0–46.0)
Hemoglobin: 12.3 g/dL (ref 12.0–15.0)
MCHC: 34.4 g/dL (ref 30.0–36.0)
MCV: 88.9 fl (ref 78.0–100.0)
Platelets: 344 10*3/uL (ref 150.0–400.0)
RBC: 4.01 Mil/uL (ref 3.87–5.11)
RDW: 14.3 % (ref 11.5–15.5)
WBC: 7 10*3/uL (ref 4.0–10.5)

## 2019-09-23 ENCOUNTER — Ambulatory Visit: Payer: 59 | Admitting: Gastroenterology

## 2019-10-31 ENCOUNTER — Ambulatory Visit: Payer: 59 | Admitting: Gastroenterology

## 2019-11-09 ENCOUNTER — Ambulatory Visit: Payer: 59 | Admitting: Family Medicine

## 2019-11-09 DIAGNOSIS — Z0289 Encounter for other administrative examinations: Secondary | ICD-10-CM

## 2019-11-15 ENCOUNTER — Telehealth: Payer: Self-pay

## 2019-11-15 NOTE — Telephone Encounter (Signed)
Nurse Assessment Nurse: Theophilus Kinds Date/Time Eilene Ghazi Time): 11/13/2019 12:16:11 PM Confirm and document reason for call. If symptomatic, describe symptoms. ---Caller states, wanting z pack called in for allergies. She is having runny/stuffy nose and sneezing. Has the patient had close contact with a person known or suspected to have the novel coronavirus illness OR traveled / lives in area with major community spread (including international travel) in the last 14 days from the onset of symptoms? * If Asymptomatic, screen for exposure and travel within the last 14 days. ---No Does the patient have any new or worsening symptoms? ---Yes Will a triage be completed? ---Yes Related visit to physician within the last 2 weeks? ---No Does the PT have any chronic conditions? (i.e. diabetes, asthma, this includes High risk factors for pregnancy, etc.) ---No Is the patient pregnant or possibly pregnant? (Ask all females between the ages of 50-55) ---No Is this a behavioral health or substance abuse call? ---No Guidelines Guideline Title Affirmed Question Affirmed Notes Nurse Date/Time (Eastern Time) Nasal Allergies (Hay Fever) [1] Nasal allergies AND [0] only certain times of year AND [3] hay fever diagnosis has never been confirmed by a HCP Theophilus Kinds 11/13/2019 12:17:47 PM Disp. Time (Eastern Time) Disposition Final UserPLEASE NOTE: All timestamps contained within this report are represented as [place] Standard Time. CONFIDENTIALTY NOTICE: This fax transmission is intended only for the addressee. It contains information that is legally privileged, confidential or otherwise protected from use or disclosure. If you are not the intended recipient, you are strictly prohibited from reviewing, disclosing, copying using or disseminating any of this information or taking any action in reliance on or regarding this information. If you have received this fax in error, please notify us immediately  by telephone so that we can arrange for its return to [place]. Phone: [phone], Toll-Free: [phone], Fax: [phone] Page: 2 of 2 Call Id: [id number] 11/13/2019 12:21:57 PM See PCP within 2 Weeks Yes Theophilus Kinds Caller Disagree/Comply Comply Caller Understands Yes PreDisposition [place] Advice Given Per Guideline SEE PCP WITHIN 2 WEEKS: * You need to be seen for this ongoing problem within the next 2 weeks. * PCP VISIT: Call your doctor (or NP/PA) during regular office hours and make an appointment. REASSURANCE AND EDUCATION: * It sounds like routine hay fever. * With appropriate medicine, symptoms can be brought under control. ANTIHISTAMINE MEDICINES FOR HAY FEVER: * You can take cetirizine or loratadine by mouth to reduce sneezing, itching and runny nose. They can help decrease or stop hay fever and allergy symptoms. * CETIRIZINE (REACTINE, ZYRTEC): The adult dose is 10 mg and you take it once a day. Cetirizine is available in the [place] as Zyrtec and in [place] as Reactine. * LORATADINE (ALAVERT, CLARITIN): The adult dose is 10 mg and you take it once a day. Loratadine is available in the [place] as Engineer, maintenance (IT); it is available in [place] as Claritin. FOR EYE ALLERGIES: * For eye symptoms, wash pollen off the face and eyelids. Then apply cold wet compresses. * Oral antihistamines will usually bring all eye symptoms under control. CALL BACK IF: * Symptoms aren't controlled in 2 days with continuous antihistamines. * You become worse. CARE ADVICE given per Nasal Allergies (Hay Fever) (Adult) guideline.

## 2019-11-15 NOTE — Telephone Encounter (Signed)
Called the patient today and she has taken some OTC sinus medication and feeling better. Offered a Video visit but she wants to give it some time and will call back if gets no better in a few days.

## 2020-01-29 IMAGING — US US ABDOMEN LIMITED
1 series · 13 of 25 positions shown · non-contrast
Comparison: None.

CLINICAL DATA: Right upper quadrant pain for several weeks

EXAM:
ULTRASOUND ABDOMEN LIMITED RIGHT UPPER QUADRANT

[Series 1: us abdomen limited · 0.20mm/px · 13 of 52 slices shown]
[im 1/52]
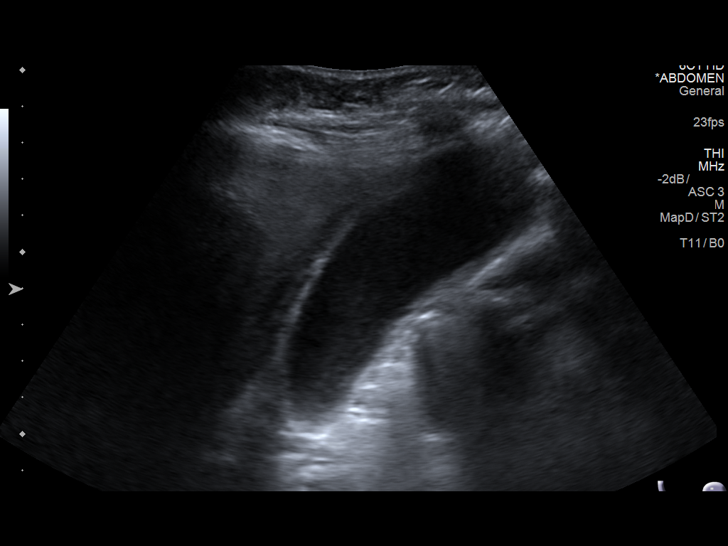
[im 5/52]
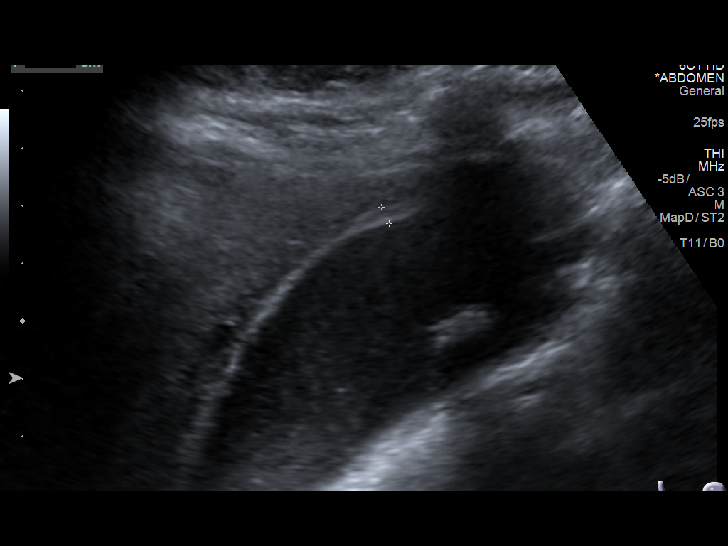
[im 9/52]
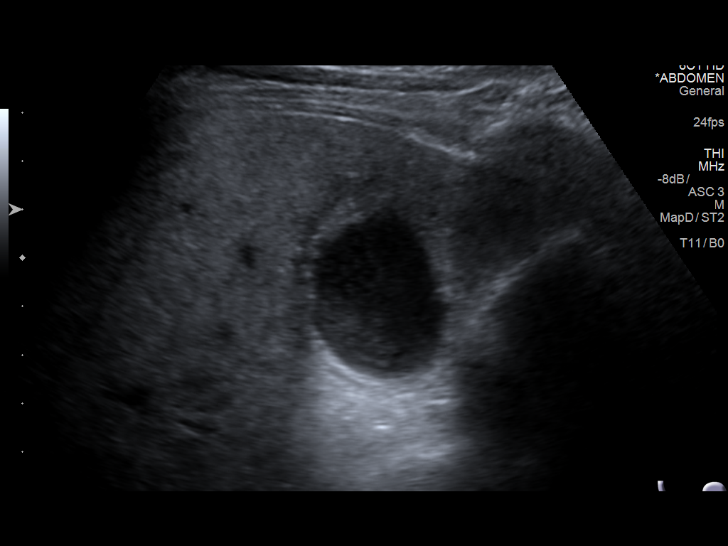
[im 13/52]
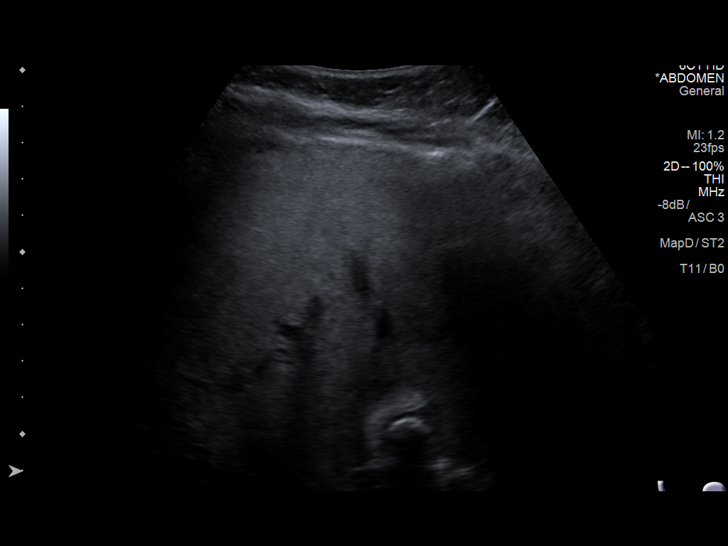
[im 18/52]
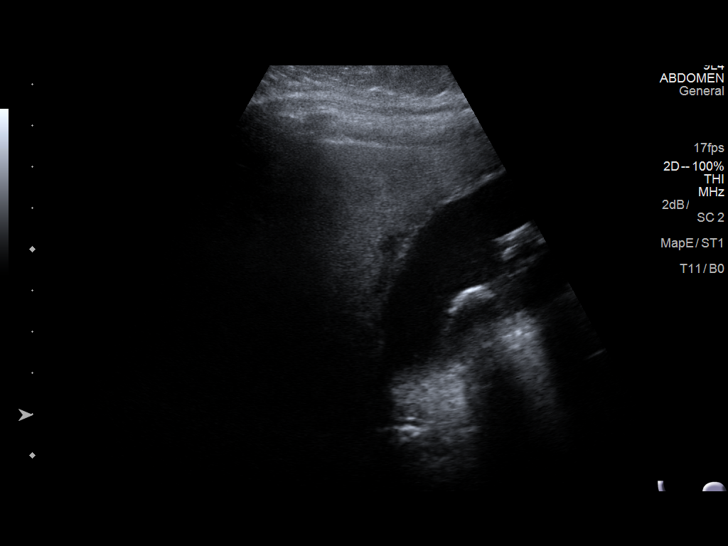
[im 22/52]
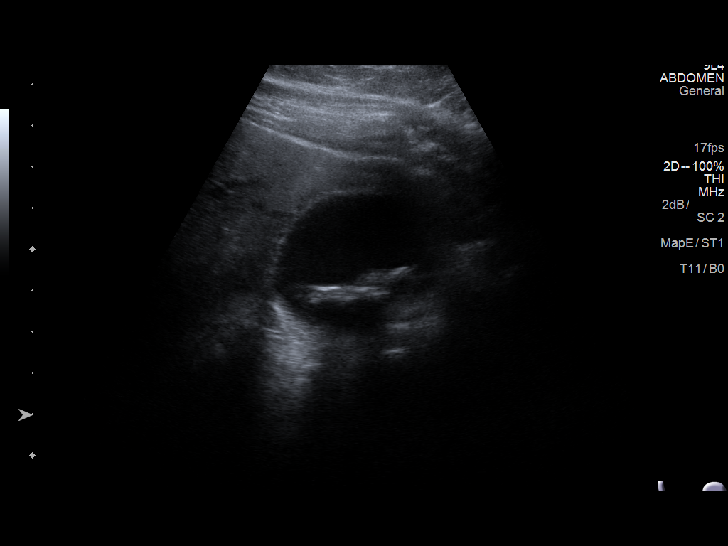
[im 26/52]
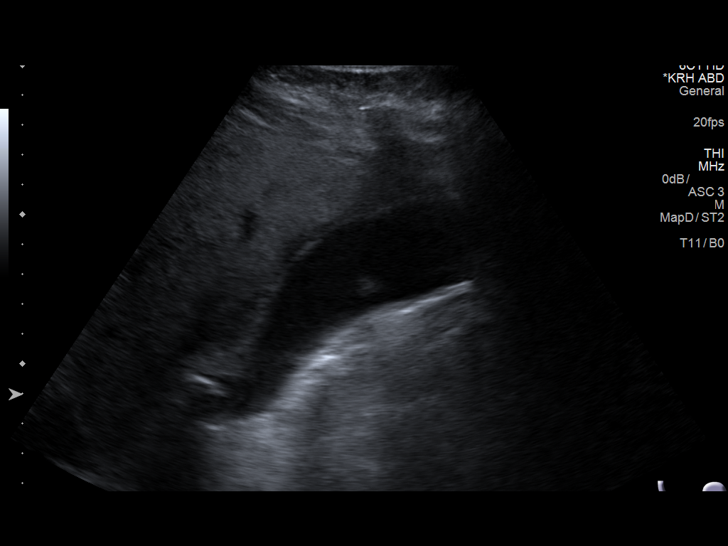
[im 30/52]
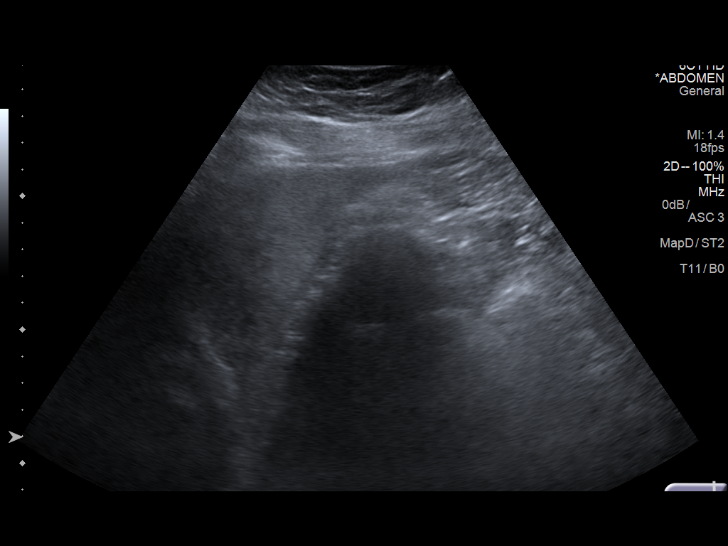
[im 35/52]
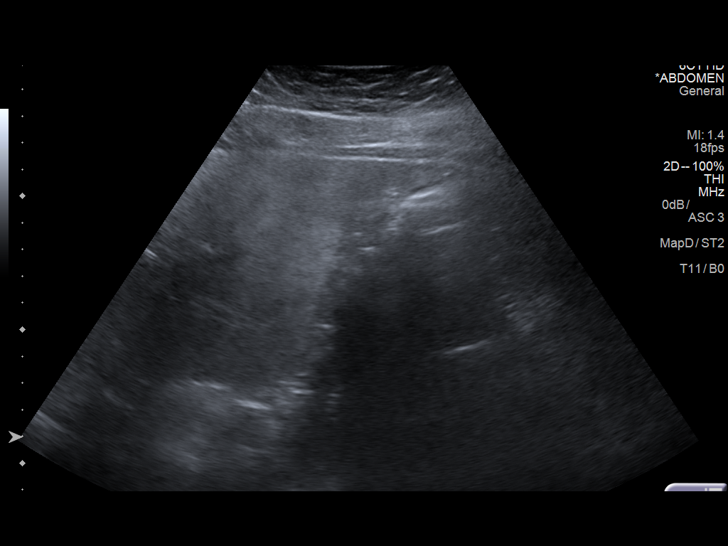
[im 39/52]
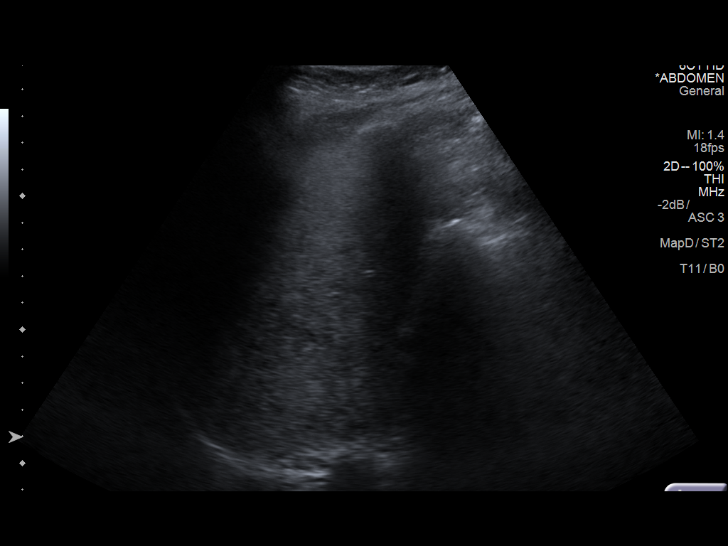
[im 43/52]
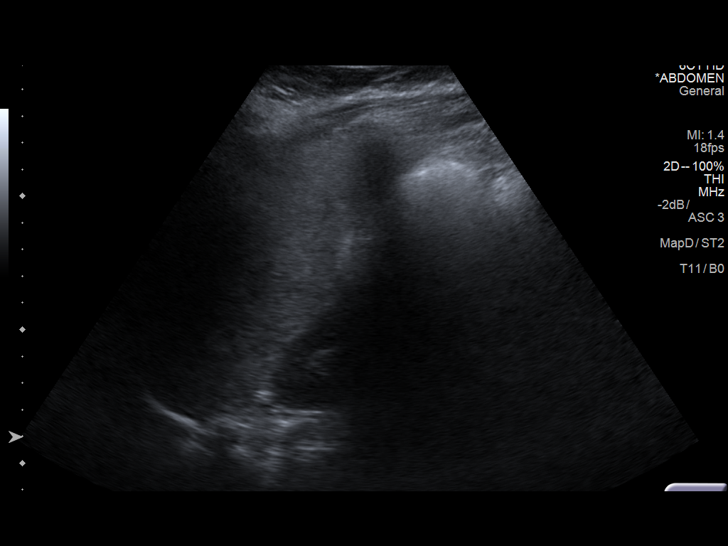
[im 47/52]
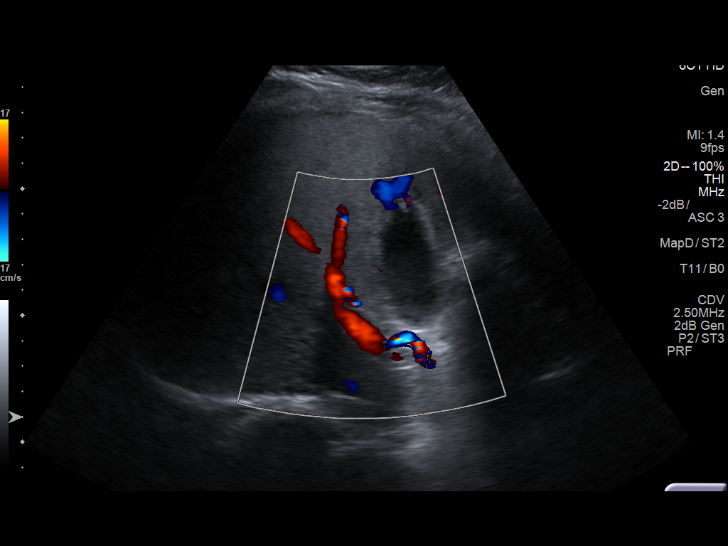
[im 52/52]
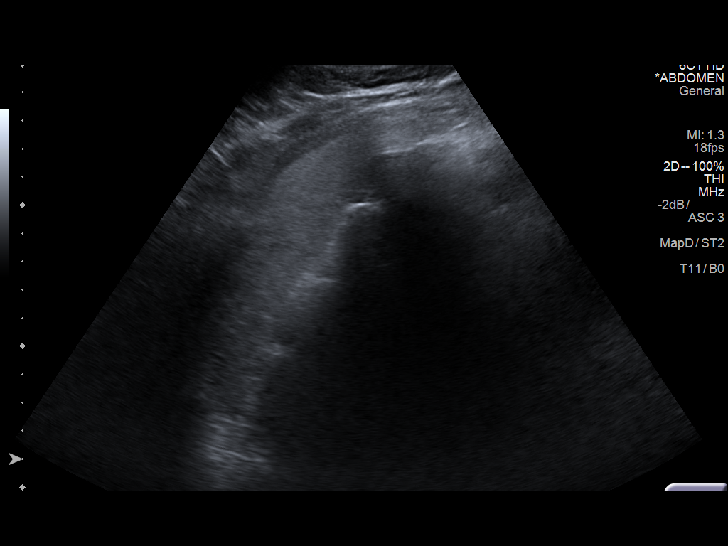

[13 of 25 positions shown; findings below may reference images not displayed]

FINDINGS: Gallbladder:

Mild distended with multiple gallstones and gallbladder sludge
within. One of the stones appears lodged within the neck of the
gallbladder. Gallbladder wall is mildly prominent. There are changes
suggestive of a mildly distended cystic duct measuring 9 mm. Minimal
pericholecystic fluid is noted.

Common bile duct:

Diameter: 4 mm.

Liver:

Focal area of increased echogenicity is noted in the left lobe of
the liver measuring approximately 5.5 cm. This is suspicious for an
underlying hemangioma. Portal vein is patent on color Doppler
imaging with normal direction of blood flow towards the liver.
IMPRESSION: Distended gallbladder with gallstones and gallbladder sludge as well
as findings suggestive of a gallstone within the gallbladder neck
and dilatation of the cystic duct. Some wall thickening and
pericholecystic fluid is noted suspicious for acute cholecystitis.

Focus of increased echogenicity within the left lobe of the liver
suggestive of hemangioma. Follow-up ultrasound in 6 months is
recommended to assess for stability.

## 2020-02-08 ENCOUNTER — Encounter: Payer: Self-pay | Admitting: Family Medicine

## 2020-02-08 ENCOUNTER — Other Ambulatory Visit: Payer: Self-pay

## 2020-02-08 ENCOUNTER — Ambulatory Visit (INDEPENDENT_AMBULATORY_CARE_PROVIDER_SITE_OTHER): Payer: No Typology Code available for payment source | Admitting: Family Medicine

## 2020-02-08 VITALS — BP 128/84 | HR 83 | Temp 98.2°F | Ht 64.0 in | Wt 212.5 lb

## 2020-02-08 DIAGNOSIS — E669 Obesity, unspecified: Secondary | ICD-10-CM

## 2020-02-08 DIAGNOSIS — E1169 Type 2 diabetes mellitus with other specified complication: Secondary | ICD-10-CM | POA: Diagnosis not present

## 2020-02-08 DIAGNOSIS — I10 Essential (primary) hypertension: Secondary | ICD-10-CM | POA: Diagnosis not present

## 2020-02-08 NOTE — Progress Notes (Addendum)
Subjective:   Chief Complaint  Patient presents with  . Follow-up    Kaitlyn Stevens is a 44 y.o. female here for follow-up of diabetes.   Nimra does not routinely monitor her sugars at home.  Patient does not require insulin.   Medications include: diet controlled Diet is fair.  Exercise: None  Hypertension Patient presents for hypertension follow up. She does monitor home blood pressures. Blood pressures ranging on average from 120's/70-80's. She is compliant with medications- Norvasc 5 mg/d, Hyzaar 100-25 mg/d. Patient has these side effects of medication: none Diet/exercise as above.   Past Medical History:  Diagnosis Date  . Essential hypertension   . History of blood transfusion 1996   with c/s done in [place]  . SVD (spontaneous vaginal delivery) 1994   twins     Related testing: Retinal exam: Done Pneumovax: done  Objective:  BP 128/84 (BP Location: Left Arm, Patient Position: Sitting, Cuff Size: Normal)   Pulse 83   Temp 98.2 F (36.8 C) (Oral)   Ht 5' 4"  (1.626 m)   Wt 212 lb 8 oz (96.4 kg)   SpO2 96%   BMI 36.48 kg/m  General:  Well developed, well nourished, in no apparent distress Lungs:  CTAB, no access msc use Cardio:  RRR, no bruits, no LE edema Psych: Age appropriate judgment and insight  Assessment:   Diabetes mellitus type 2 in obese (HCC) - Plan: Hemoglobin A1c  Essential hypertension   Plan:   1. Counseled on diet and exercise. Ck A1c. Might need to add metformin.  2. Cont Hyzaar 100-25 mg/d and Norvasc 5 mg/d. OK to stop monitoring home BP.  F/u in 6 mo. The patient voiced understanding and agreement to the plan.  [place], DO 02/08/20 7:46 AM

## 2020-02-08 NOTE — Patient Instructions (Signed)
Give [place] 2-3 business days to get the results of your labs back.   Keep the diet clean and stay active.  Aim to do some physical exertion for 150 minutes per week. This is typically divided into 5 days per week, 30 minutes per day. The activity should be enough to get your heart rate up. Anything is better than nothing if you have time constraints.  Consider YouTube for home exercise options.  Let us know if you need anything.

## 2020-02-09 LAB — HEMOGLOBIN A1C
Hgb A1c MFr Bld: 6.9 % of total Hgb — ABNORMAL HIGH (ref <5.7)
Mean Plasma Glucose: 151 (calc)
eAG (mmol/L): 8.4 (calc)

## 2020-02-17 ENCOUNTER — Telehealth: Payer: Self-pay | Admitting: Family Medicine

## 2020-02-17 MED ORDER — PROMETHAZINE-DM 6.25-15 MG/5ML PO SYRP: 5.0000 mL | ORAL_SOLUTION | Freq: Four times a day (QID) | ORAL | 0 refills | Status: DC | PRN

## 2020-02-17 NOTE — Telephone Encounter (Signed)
Patient informed rx sent in. And of PCP instructions.

## 2020-02-17 NOTE — Telephone Encounter (Signed)
Sent in syrup. F/u next week if no better. Ty.

## 2020-02-17 NOTE — Telephone Encounter (Signed)
Patient states she wants something for her cold. She has a cough. We have no available appointment, I offer patient to call back to get a Saturday appt patient refuse.

## 2020-02-18 ENCOUNTER — Telehealth (INDEPENDENT_AMBULATORY_CARE_PROVIDER_SITE_OTHER): Payer: No Typology Code available for payment source | Admitting: Family Medicine

## 2020-02-18 ENCOUNTER — Encounter: Payer: Self-pay | Admitting: Family Medicine

## 2020-02-18 ENCOUNTER — Other Ambulatory Visit: Payer: Self-pay

## 2020-02-18 DIAGNOSIS — J069 Acute upper respiratory infection, unspecified: Secondary | ICD-10-CM

## 2020-02-18 NOTE — Progress Notes (Addendum)
Virtual Visit via Video Note  I connected with pt on 02/18/20 at 12:20 PM EDT by a video enabled telemedicine application and verified that I am speaking with the correct person using two identifiers.  Location patient: home Location provider:work or home office Persons participating in the virtual visit: patient, provider  I discussed the limitations of evaluation and management by telemedicine and the availability of in person appointments. The patient expressed understanding and agreed to proceed.  Telemedicine visit is a necessity given the COVID-19 restrictions in place at the current time.  HPI: 44 y/o WF with diet controlled DM, HTN, and obesity who is being seen today for cough. Onset of runny nose and cough about 48h ago.  Onset of ST last night.  No fever. No HA, body aches, or severe fatigue.  No SOB, wheezing, or chest pain. Eating and drinking fine.  No loss of taste or smell. No known sick contacts. Has had covid vaccine. Works from home alone. Cough med rx'd by her PCP yesterday. She is not taking any med for her ST pain--she asks what she should take.  PMP AWARE reviewed today: no rx data listed for this patient.  ROS: See pertinent positives and negatives per HPI.  Past Medical History:  Diagnosis Date  . Essential hypertension   . History of blood transfusion 1996   with c/s done in [place]  . SVD (spontaneous vaginal delivery) 1994   twins    Past Surgical History:  Procedure Laterality Date  . CESAREAN SECTION  1996   x 1 twins  . CHOLECYSTECTOMY N/A 07/16/2017   Procedure: LAPAROSCOPIC CHOLECYSTECTOMY;  Surgeon: Stark Klein, MD;  Location: [facility];  Service: General;  Laterality: N/A;  . POLYPECTOMY  02/13/2012   Procedure: POLYPECTOMY;  Surgeon: Sharene Butters, MD;  Location: [facility];  Service: Gynecology;  Laterality: N/A;  . TUBAL LIGATION       Current Outpatient Medications:  .  amLODipine (NORVASC) 5 MG tablet, Take 1 tablet (5 mg total) by mouth  daily., Disp: 90 tablet, Rfl: 2 .  atorvastatin (LIPITOR) 40 MG tablet, Take 1 tablet (40 mg total) by mouth daily., Disp: 90 tablet, Rfl: 3 .  losartan-hydrochlorothiazide (HYZAAR) 100-25 MG tablet, Take 1 tablet by mouth daily., Disp: 90 tablet, Rfl: 2 .  promethazine-dextromethorphan (PROMETHAZINE-DM) 6.25-15 MG/5ML syrup, Take 5 mLs by mouth 4 (four) times daily as needed., Disp: 118 mL, Rfl: 0 .  acetaminophen (TYLENOL) 500 MG tablet, Take 1 tablet (500 mg total) by mouth every 6 (six) hours as needed for mild pain or headache. (Patient not taking: Reported on 02/18/2020), Disp: 30 tablet, Rfl: 0 .  loratadine (CLARITIN) 10 MG tablet, Take 1 tablet (10 mg total) by mouth daily. (Patient not taking: Reported on 02/18/2020), Disp: 30 tablet, Rfl: 11 .  polyethylene glycol powder (MIRALAX) powder, Take 17 g by mouth daily. (Patient not taking: Reported on 02/18/2020), Disp: 255 g, Rfl: 0  EXAM:  VITALS per patient if applicable:  Vitals with BMI 02/08/2020 08/10/2019 06/16/2018  Height 5' 4"  5' 4"  5' 4"   Weight 212 lbs 8 oz 211 lbs 8 oz 210 lbs 8 oz  BMI 36.46 55.73 22.02  Systolic 542 706 237  Diastolic 84 74 80  Pulse 83 72 71     GENERAL: alert, oriented, appears well and in no acute distress  HEENT: atraumatic, conjunttiva clear, no obvious abnormalities on inspection of external nose and ears  NECK: normal movements of the head and neck  LUNGS: on inspection no signs of respiratory distress, breathing rate appears normal, no obvious gross SOB, gasping or wheezing  CV: no obvious cyanosis  MS: moves all visible extremities without noticeable abnormality  PSYCH/NEURO: pleasant and cooperative, no obvious depression or anxiety, speech and thought processing grossly intact  LABS: none today   Chemistry      Component Value Date/Time   NA 137 08/10/2019 0722   K 3.9 08/10/2019 0722   CL 100 08/10/2019 0722   CO2 29 08/10/2019 0722   BUN 12 08/10/2019 0722   CREATININE 0.91  08/10/2019 0722      Component Value Date/Time   CALCIUM 9.2 08/10/2019 0722   ALKPHOS 74 08/10/2019 0722   AST 17 08/10/2019 0722   ALT 17 08/10/2019 0722   BILITOT 0.4 08/10/2019 0722     Lab Results  Component Value Date   WBC 7.0 09/09/2019   HGB 12.3 09/09/2019   HCT 35.7 (L) 09/09/2019   MCV 88.9 09/09/2019   PLT 344.0 09/09/2019   Lab Results  Component Value Date   HGBA1C 6.9 (H) 02/08/2020    ASSESSMENT AND PLAN:  Discussed the following assessment and plan:  URI with cough, highly suspect viral etiology. Covid vaccinated. Has ST, but clinical picture not really consistent with strep pharyngitis. I recommended she treat symptoms with current cough med, add 1000 mg tylenol every 6 hours as needed for ST pain. I did not recommend covid testing at this time. 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 Signs/symptoms to call or return for were reviewed and pt expressed understanding.  -we discussed possible serious and likely etiologies, options for evaluation and workup, limitations of telemedicine visit vs in person visit, treatment, treatment risks and precautions. Pt prefers to treat via telemedicine empirically rather than in person at this moment.   I discussed the assessment and treatment plan with the patient. The patient was provided an opportunity to ask questions and all were answered. The patient agreed with the plan and demonstrated an understanding of the instructions.   F/u: if not signif improving in 4-5d or if worsening prior to then  Signed:  Crissie Sickles, MD           02/18/2020

## 2020-02-20 ENCOUNTER — Telehealth: Payer: Self-pay | Admitting: Family Medicine

## 2020-02-20 MED ORDER — PROMETHAZINE-CODEINE 6.25-10 MG/5ML PO SYRP: 5.0000 mL | ORAL_SOLUTION | Freq: Three times a day (TID) | ORAL | 0 refills | Status: DC | PRN

## 2020-02-20 MED ORDER — BENZONATATE 100 MG PO CAPS: 100.0000 mg | ORAL_CAPSULE | Freq: Three times a day (TID) | ORAL | 0 refills | Status: DC | PRN

## 2020-02-20 NOTE — Telephone Encounter (Signed)
The patient was told what was sent in for her cough, but she was once before given something with Codeine in it and worked really well---she would like that

## 2020-02-20 NOTE — Addendum Note (Signed)
Addended by: Ames Coupe on: 02/20/2020 04:59 PM   Modules accepted: Orders

## 2020-02-20 NOTE — Telephone Encounter (Signed)
Pt called to check on status of call back from office.  I let pt know provider and assistant have been in clinic all morning long and a call will be returned to her when assistant is available to do so.

## 2020-02-20 NOTE — Addendum Note (Signed)
Addended by: Ames Coupe on: 02/20/2020 12:22 PM   Modules accepted: Orders

## 2020-02-20 NOTE — Telephone Encounter (Signed)
Nurse Assessment Nurse: Waymond Cera, RN, Benjamine Mola Date/Time (Eastern Time): 02/18/2020 12:00:46 PM Confirm and document reason for call. If symptomatic, describe symptoms. ---Caller states cough and congestion. States sore throat. Does the patient have any new or worsening symptoms? ---Yes Will a triage be completed? ---Yes Related visit to physician within the last 2 weeks? ---No Does the PT have any chronic conditions? (i.e. diabetes, asthma, this includes High risk factors for pregnancy, etc.) ---Yes List chronic conditions. ---HTN Is the patient pregnant or possibly pregnant? (Ask all females between the ages of 81-55) ---No Is this a behavioral health or substance abuse call? ---No Guidelines Guideline Title Affirmed Question Affirmed Notes Nurse Date/Time (Eastern Time) COVID-19 - Diagnosed or Suspected HIGH RISK for severe COVID complications (e.g., age > 68 years, obesity with BMI > 25, pregnant, chronic lung disease or other chronic medical condition) (Exception: Already seen by PCP and no new or worsening symptoms.) Cantrell, RN, Benjamine Mola 02/18/2020 12:01:38 PM COVID-19 - Diagnosed or Suspected HIGH RISK for severe COVID complications (e.g., age > 71 years, Cantrell, [place], Benjamine Mola 02/18/2020 12:08:34 PM PLEASE NOTE: All timestamps contained within this report are represented as [place] Standard Time. CONFIDENTIALTY NOTICE: This fax transmission is intended only for the addressee. It contains information that is legally privileged, confidential or otherwise protected from use or disclosure. If you are not the intended recipient, you are strictly prohibited from reviewing, disclosing, copying using or disseminating any of this information or taking any action in reliance on or regarding this information. If you have received this fax in error, please notify us immediately by telephone so that we can arrange for its return to [place]. Phone: [phone], Toll-Free: [phone],  Fax: [phone] Page: 2 of 3 Call Id: [id number] Guidelines Guideline Title Affirmed Question Affirmed Notes Nurse Date/Time Eilene Ghazi Time) obesity with BMI > 25, pregnant, chronic lung disease or other chronic medical condition) (Exception: Already seen by PCP and no new or worsening symptoms.) Disp. Time Eilene Ghazi Time) Disposition Final User 02/18/2020 12:06:39 PM Call PCP Now Waymond Cera, RN, Benjamine Mola 02/18/2020 12:13:01 PM Go to ED Now (or PCP triage) Yes Cantrell, RN, Benjamine Mola Disposition Overriden: Call PCP Now Override Reason: Specify reason. (Please document in 'advice recommended' section) Caller Disagree/Comply Comply Caller Understands Yes PreDisposition [place] Advice Given Per Guideline CALL PCP NOW: * You need to discuss this with your doctor (or NP/PA). * I'll page the on-call provider now. If you haven't heard from the provider (or me) within 30 minutes, call again. CALL BACK IF: * You become worse CARE ADVICE given per COVID-19 - DIAGNOSED OR SUSPECTED (Adult) guideline. HOW TO PROTECT OTHERS - WHEN YOU ARE SICK WITH COVID-19: * STAY HOME A MINIMUM OF 10 DAYS: Home isolation is needed for at least 10 days after the symptoms started. Stay home from school or work if you are sick. Do NOT go to religious services, child care centers, shopping, or other public places. Do NOT use public transportation (e.g., bus, taxis, ride-sharing). Do NOT allow any visitors to your home. Leave the house only if you need to seek urgent medical care. GO TO ED NOW (OR PCP TRIAGE): * IF NO PCP (PRIMARY CARE PROVIDER) SECOND-LEVEL TRIAGE: You need to be seen within the next hour. Go to the [facility] at _____________ [place] as soon as you can. CARE ADVICE given per COVID-19 - DIAGNOSED OR SUSPECTED (Adult) guideline. After Care Instructions Given Call Event Type User Date / Time Description Education document email Waymond Cera, RNBenjamine Mola 02/18/2020 12:05:56 PM COVID-19  Diagnosed  or Suspected Education document email Waymond Cera, RNBenjamine Mola 02/18/2020 12:05:56 PM COVID-19 Bridget Hartshorn Education document email Waymond Cera, RN, Benjamine Mola 02/18/2020 12:05:56 PM COVID-19 Prevention Education document email Waymond Cera, RN, Benjamine Mola 02/18/2020 12:05:56 PM What To Do If You Are Sick With COVID-19_English Comments User: Sharol Given, RN Date/Time Eilene Ghazi Time): 02/18/2020 12:07:03 PM Caller's BMI is 36. User: Sharol Given, RN Date/Time Eilene Ghazi Time): 02/18/2020 12:12:55 PM PLEASE NOTE: All timestamps contained within this report are represented as [place] Standard Time. CONFIDENTIALTY NOTICE: This fax transmission is intended only for the addressee. It contains information that is legally privileged, confidential or otherwise protected from use or disclosure. If you are not the intended recipient, you are strictly prohibited from reviewing, disclosing, copying using or disseminating any of this information or taking any action in reliance on or regarding this information. If you have received this fax in error, please notify us immediately by telephone so that we can arrange for its return to [place]. Phone: [phone], Toll-Free: [phone], Fax: [phone] Page: 3 of 3 Call Id: [id number] Comments Looked no on-call. Called caller back. Re-ran GL. Upgraded since no-call. Caller going to call for virtual appointment. Advised if they can't get her in the Saturday clinic to be seen in [facility] in next hour. Verbalizes understanding. Referrals [facility]

## 2020-02-20 NOTE — Telephone Encounter (Signed)
CallerLarene Beach  Call Back # [phone]   promethazine-dextromethorphan (PROMETHAZINE-DM) 6.25-15 MG/5ML syrup [[id number]]     Patient states medication does not work for her, patient is requesting a new prescription sent to pharamacy .

## 2020-02-20 NOTE — Addendum Note (Signed)
Addended by: Sharon Seller B on: 02/20/2020 01:20 PM   Modules accepted: Orders

## 2020-05-13 ENCOUNTER — Other Ambulatory Visit: Payer: Self-pay | Admitting: Family Medicine

## 2020-05-13 DIAGNOSIS — I10 Essential (primary) hypertension: Secondary | ICD-10-CM

## 2020-05-14 ENCOUNTER — Other Ambulatory Visit: Payer: Self-pay | Admitting: Family Medicine

## 2020-05-14 DIAGNOSIS — I10 Essential (primary) hypertension: Secondary | ICD-10-CM

## 2020-05-14 MED ORDER — HYDROCHLOROTHIAZIDE 25 MG PO TABS: 25.0000 mg | ORAL_TABLET | Freq: Every day | ORAL | 0 refills | Status: DC

## 2020-05-14 MED ORDER — LOSARTAN POTASSIUM 100 MG PO TABS
100.0000 mg | ORAL_TABLET | Freq: Every day | ORAL | 0 refills | Status: DC
Start: 2020-05-14 — End: 2020-08-13

## 2020-05-23 ENCOUNTER — Telehealth: Payer: Self-pay | Admitting: Family Medicine

## 2020-05-23 NOTE — Telephone Encounter (Signed)
Patient would for Dr.Wendling nurse to give her a call in regards to her medicine   Please advice

## 2020-05-23 NOTE — Telephone Encounter (Signed)
Called and clarified with the patient her medications. Previously she had been on the losartan/hctz combo pill,but picked up from the pharmacy new prescriptions today and she was given the losatan 100 mg and HCTZ 25 as 2 pills. The patient verbalized understanding of her medications and why the change. She had no other questions or concerns.

## 2020-05-24 ENCOUNTER — Telehealth: Payer: Self-pay | Admitting: Family Medicine

## 2020-05-24 NOTE — Telephone Encounter (Signed)
Patient would like robin to give her a call ASAP patient did not give the nature of the call

## 2020-05-24 NOTE — Telephone Encounter (Signed)
Caller states that she needs to be tested for COVID. Additional Comment Caller states that she would like to speak Robin, Dr. Irene Limbo nurse, first thing this morning if possible.  Telephone: [phone]

## 2020-05-24 NOTE — Telephone Encounter (Signed)
Spoke w/ Pt- informed Shirlean Mylar and Dr. Nani Ravens are off on Thursdays. I had called to give her number for covid testing, but she was leaving an urgent care now. She informed she tested positive. She wanted to know what she could take for cough, recommended OTC Mucinex DM or Robutussin DM- if not improving/helping she will let us know.

## 2020-05-25 ENCOUNTER — Telehealth: Payer: Self-pay | Admitting: Family Medicine

## 2020-05-25 MED ORDER — DEXTROMETHORPHAN HBR 15 MG/5ML PO SYRP: 10.0000 mL | ORAL_SOLUTION | Freq: Four times a day (QID) | ORAL | 0 refills | Status: DC | PRN

## 2020-05-25 NOTE — Telephone Encounter (Signed)
The patient called to inform she tested postive for Covid yesterday. Her daughter is positive. She would like something for the cough---what she previously had did not work well. A cough is her only symptoms

## 2020-05-25 NOTE — Telephone Encounter (Signed)
Kaitlyn Stevens would like for you to call her, she did not disclose what she wants to talk to you about

## 2020-05-26 ENCOUNTER — Other Ambulatory Visit: Payer: Self-pay | Admitting: Family Medicine

## 2020-05-26 MED ORDER — PROMETHAZINE-CODEINE 6.25-10 MG/5ML PO SYRP: 5.0000 mL | ORAL_SOLUTION | Freq: Four times a day (QID) | ORAL | 0 refills | Status: DC | PRN

## 2020-05-26 NOTE — Progress Notes (Signed)
I sent her in some Phenergan with Codeine.  [facility] is going to let her know there is a high likelihood that there is a regional shortage of all similar cough medications.

## 2020-06-01 ENCOUNTER — Telehealth: Payer: Self-pay | Admitting: Family Medicine

## 2020-06-01 NOTE — Telephone Encounter (Signed)
5 d after onset of s/s's if recovered per CDC. Ty.

## 2020-06-01 NOTE — Telephone Encounter (Signed)
Caller Kaitlyn Stevens  Call Back # [phone]  Patient called, requesting a call back from [place]. Patient refused to give the nature of the matter she was calling for.   Please advise

## 2020-06-01 NOTE — Telephone Encounter (Signed)
Patient tested positive for Covid on Wednesday. She said was a slight second line. She extended her Covid leave. She now thinks there will be a form to complete and will have it faxed. Base on her test on Wednesday she would like to know when to return to work. She is concerned about taking another test

## 2020-06-04 ENCOUNTER — Encounter: Payer: Self-pay | Admitting: Family Medicine

## 2020-06-04 ENCOUNTER — Other Ambulatory Visit: Payer: Self-pay

## 2020-06-04 ENCOUNTER — Telehealth (INDEPENDENT_AMBULATORY_CARE_PROVIDER_SITE_OTHER): Payer: No Typology Code available for payment source | Admitting: Family Medicine

## 2020-06-04 DIAGNOSIS — U071 COVID-19: Secondary | ICD-10-CM | POA: Diagnosis not present

## 2020-06-04 MED ORDER — PREDNISONE 20 MG PO TABS: 40.0000 mg | ORAL_TABLET | Freq: Every day | ORAL | 0 refills | Status: DC

## 2020-06-04 MED ORDER — BENZONATATE 100 MG PO CAPS: 100.0000 mg | ORAL_CAPSULE | Freq: Three times a day (TID) | ORAL | 0 refills | Status: DC | PRN

## 2020-06-04 MED ORDER — PREDNISONE 20 MG PO TABS: 40.0000 mg | ORAL_TABLET | Freq: Every day | ORAL | 0 refills | Status: AC

## 2020-06-04 NOTE — Telephone Encounter (Signed)
Patient had a video visit today with PCP 06/04/20 and all questions covered at visit.

## 2020-06-04 NOTE — Progress Notes (Signed)
Chief Complaint  Patient presents with  . Cough    Kaitlyn Stevens here for URI complaints. Due to COVID-19 pandemic, we are interacting via web portal for an electronic face-to-face visit. I verified patient's ID using 2 identifiers. Patient agreed to proceed with visit via this method. Patient is at home, I am at office. Patient and I are present for visit.   Duration: 2 weeks; s/s's startd on 1/12.  Associated symptoms: sinus congestion, wheezing, fatigue and cough Denies: sinus congestion, sinus pain, rhinorrhea, itchy watery eyes, ear pain, ear drainage, sore throat, shortness of breath, myalgia and fevers Treatment to date: Phenergan-codeine Sick contacts: No  First day out of work was 1/13.  Tested positive on 1/19 most recently.   Past Medical History:  Diagnosis Date  . Essential hypertension   . History of blood transfusion 1996   with c/s done in [place]  . SVD (spontaneous vaginal delivery) 1994   twins   Objective No conversational dyspnea Age appropriate judgment and insight Nml affect and mood  COVID-19 - Plan: benzonatate (TESSALON) 100 MG capsule, predniSONE (DELTASONE) 20 MG tablet  5 d pred burst if wheezing continues. Tessalon perles for during the day. Syrup at night as it makes her drowsy. Letter for work to excuse 1/13-1/31 povidedr, FMLA forms to reflect this when she gets it to [place].  Continue to push fluids, practice good hand hygiene, cover mouth when coughing. F/u prn. If starting to experience irreplaceable fluid loss, shaking, or shortness of breath, seek immediate care. Pt voiced understanding and agreement to the plan.  [place], DO 06/04/20 8:38 AM

## 2020-06-04 NOTE — Addendum Note (Signed)
Addended by: Sharon Seller B on: 06/04/2020 08:49 AM   Modules accepted: Orders

## 2020-07-03 ENCOUNTER — Telehealth: Payer: Self-pay | Admitting: Family Medicine

## 2020-07-03 NOTE — Telephone Encounter (Signed)
Document faxed to our office to be filled out by provider (FMLA 3 pages) Document put at front office tray under providers name.

## 2020-07-03 NOTE — Telephone Encounter (Signed)
The patient needed to know our fax number to fax over paperwork for being out of work for Covid.

## 2020-07-03 NOTE — Telephone Encounter (Signed)
CallerElliana Stevens  Call Back : [phone]  Patient states she would like a call back , patient did not disclose reason or give any details.

## 2020-07-04 NOTE — Telephone Encounter (Signed)
PCP completed form and faxed. Called the patient and mailed her the original and made a copy for scan. Received fax confirmation

## 2020-08-13 ENCOUNTER — Other Ambulatory Visit: Payer: Self-pay | Admitting: Family Medicine

## 2020-08-13 DIAGNOSIS — I10 Essential (primary) hypertension: Secondary | ICD-10-CM

## 2020-08-17 ENCOUNTER — Other Ambulatory Visit: Payer: Self-pay | Admitting: Family Medicine

## 2020-08-17 DIAGNOSIS — E1169 Type 2 diabetes mellitus with other specified complication: Secondary | ICD-10-CM

## 2021-04-30 ENCOUNTER — Telehealth: Payer: Self-pay | Admitting: Family Medicine

## 2021-04-30 NOTE — Telephone Encounter (Signed)
Pt scheduled an appt for a hemorrhoid but wondered if she could just get something prescribed or otc recommendations so she doesn't have to come in, please advise.

## 2021-04-30 NOTE — Telephone Encounter (Signed)
Patient informed and stated she has appt. With PCP tomorrow and will discuss further

## 2021-05-01 ENCOUNTER — Other Ambulatory Visit: Payer: Self-pay | Admitting: Family Medicine

## 2021-05-01 ENCOUNTER — Encounter: Payer: Self-pay | Admitting: Family Medicine

## 2021-05-01 ENCOUNTER — Telehealth: Payer: Self-pay | Admitting: Family Medicine

## 2021-05-01 ENCOUNTER — Ambulatory Visit (INDEPENDENT_AMBULATORY_CARE_PROVIDER_SITE_OTHER): Payer: No Typology Code available for payment source | Admitting: Family Medicine

## 2021-05-01 VITALS — BP 132/82 | HR 100 | Temp 98.7°F | Ht 64.0 in | Wt 210.4 lb

## 2021-05-01 DIAGNOSIS — K644 Residual hemorrhoidal skin tags: Secondary | ICD-10-CM

## 2021-05-01 MED ORDER — NITROGLYCERIN 0.4 % RE OINT: TOPICAL_OINTMENT | RECTAL | 0 refills | Status: DC

## 2021-05-01 MED ORDER — HYDROCORTISONE (PERIANAL) 2.5 % EX CREA: 1.0000 "application " | TOPICAL_CREAM | Freq: Two times a day (BID) | CUTANEOUS | 0 refills | Status: DC

## 2021-05-01 NOTE — Progress Notes (Signed)
Chief Complaint  Patient presents with   Hemorrhoids    Subjective: Patient is a 45 y.o. female here for hemorrhoids.  Chronic issue, has been bleeding. Started bleeding over past couple weeks. Has been using Prep H and soaks with some relief. No inj or trauma. Has been wiping more and having more BM's lately, no constipation. No pain or itching.   Past Medical History:  Diagnosis Date   Essential hypertension    History of blood transfusion 1996   with c/s done in [place]   SVD (spontaneous vaginal delivery) 1994   twins    Objective: BP 132/82    Pulse 100    Temp 98.7 F (37.1 C) (Oral)    Ht 5' 4"  (1.626 m)    Wt 210 lb 6 oz (95.4 kg)    SpO2 99%    BMI 36.11 kg/m  General: Awake, appears stated age Abd: BS+, S, NT, ND Rectal: Examined in presence of female chaperone.  Large external hemorrhoids noted throughout, there is no tenderness to palpation or active bleeding, I do not appreciate any fissuring.  I did not appreciate a thrombus inside any of the hemorrhoids. Lungs: No accessory muscle use Psych: Age appropriate judgment and insight, normal affect and mood  Assessment and Plan: External hemorrhoids - Plan: hydrocortisone (ANUSOL-HC) 2.5 % rectal cream, Nitroglycerin 0.4 % OINT  Stay hydrated, hydrocortisone cream for comfort.  Keep bowel movements smooth.  Nitro ointment if insurance/cost allows.  I do not think she needs removal of the thrombus at this time. Follow-up as originally scheduled. The patient voiced understanding and agreement to the plan.  [place], DO 05/01/21  12:45 PM

## 2021-05-01 NOTE — Telephone Encounter (Signed)
Pt only received one of her rx she was prescribed today. Missing rx:  Medication: RECTIV 0.4 % OINT   Has the patient contacted their pharmacy? Yes.     Preferred Pharmacy: [id number] [place] [address] [id number] Phone:  [phone] Fax:  [phone] .

## 2021-05-01 NOTE — Patient Instructions (Addendum)
Make sure your bowels are moving well.   Use the cream for comfort. The ointment should help with increasing blood flow and improving healing. Don't fill it if it is too expensive.   Let us know if you need anything.

## 2021-05-01 NOTE — Telephone Encounter (Signed)
Patient informed of PCP response

## 2021-05-01 NOTE — Telephone Encounter (Signed)
This was sent in and may need a PA. Have not seen anything on Covermymeds

## 2021-05-16 ENCOUNTER — Telehealth: Payer: Self-pay | Admitting: Family Medicine

## 2021-05-16 NOTE — Telephone Encounter (Signed)
Called the patient and she is having just a cough/covid test was negative, she has a stuffy nose. No other symptoms.

## 2021-05-16 NOTE — Telephone Encounter (Signed)
The patient does not want to come in.  She did have some tessalon perles/

## 2021-05-16 NOTE — Telephone Encounter (Signed)
She does have some tessalon perles and promethazine left over from a previous illness. She stated she will take the perles during the day time//and promethazine at night. If she does not improve or worsens she will call back for an appointment

## 2021-05-16 NOTE — Telephone Encounter (Signed)
Pt states she's had a cough for the last few days and would like a cough syrup sent to her pharmacy. She was advised to com in for a OV in order to get medication but she stated that she did not want to come in since she is not feeling sick and it's just a cough that wont go away. Patient stated she took a covid test on

## 2021-05-16 NOTE — Telephone Encounter (Signed)
Patient stated she took a covid test on 01/03 and it was negative. She would like a call back whenever possible. Please advice.

## 2021-07-23 ENCOUNTER — Ambulatory Visit (INDEPENDENT_AMBULATORY_CARE_PROVIDER_SITE_OTHER): Payer: No Typology Code available for payment source | Admitting: Family Medicine

## 2021-07-23 ENCOUNTER — Encounter: Payer: Self-pay | Admitting: Family Medicine

## 2021-07-23 VITALS — BP 138/80 | HR 91 | Temp 98.0°F | Ht 64.0 in | Wt 201.0 lb

## 2021-07-23 DIAGNOSIS — E1169 Type 2 diabetes mellitus with other specified complication: Secondary | ICD-10-CM

## 2021-07-23 DIAGNOSIS — Z1211 Encounter for screening for malignant neoplasm of colon: Secondary | ICD-10-CM

## 2021-07-23 DIAGNOSIS — Z1159 Encounter for screening for other viral diseases: Secondary | ICD-10-CM

## 2021-07-23 DIAGNOSIS — Z0000 Encounter for general adult medical examination without abnormal findings: Secondary | ICD-10-CM

## 2021-07-23 DIAGNOSIS — E669 Obesity, unspecified: Secondary | ICD-10-CM

## 2021-07-23 LAB — COMPREHENSIVE METABOLIC PANEL
ALT: 17 U/L (ref 0–35)
AST: 17 U/L (ref 0–37)
Albumin: 4.2 g/dL (ref 3.5–5.2)
Alkaline Phosphatase: 80 U/L (ref 39–117)
BUN: 16 mg/dL (ref 6–23)
CO2: 31 mEq/L (ref 19–32)
Calcium: 9.7 mg/dL (ref 8.4–10.5)
Chloride: 98 mEq/L (ref 96–112)
Creatinine, Ser: 1.14 mg/dL (ref 0.40–1.20)
GFR: 57.3 mL/min — ABNORMAL LOW (ref >60.00)
Glucose, Bld: 138 mg/dL — ABNORMAL HIGH (ref 70–99)
Potassium: 3.6 mEq/L (ref 3.5–5.1)
Sodium: 136 mEq/L (ref 135–145)
Total Bilirubin: 0.6 mg/dL (ref 0.2–1.2)
Total Protein: 7.8 g/dL (ref 6.0–8.3)

## 2021-07-23 LAB — CBC
HCT: 34.3 % — ABNORMAL LOW (ref 36.0–46.0)
Hemoglobin: 11.5 g/dL — ABNORMAL LOW (ref 12.0–15.0)
MCHC: 33.4 g/dL (ref 30.0–36.0)
MCV: 84.1 fl (ref 78.0–100.0)
Platelets: 415 10*3/uL — ABNORMAL HIGH (ref 150.0–400.0)
RBC: 4.08 Mil/uL (ref 3.87–5.11)
RDW: 15.1 % (ref 11.5–15.5)
WBC: 5.5 10*3/uL (ref 4.0–10.5)

## 2021-07-23 LAB — LIPID PANEL
Cholesterol: 95 mg/dL (ref 0–200)
HDL: 36.4 mg/dL — ABNORMAL LOW (ref >39.00)
LDL Cholesterol: 41 mg/dL (ref 0–99)
NonHDL: 59.02
Total CHOL/HDL Ratio: 3
Triglycerides: 88 mg/dL (ref 0.0–149.0)
VLDL: 17.6 mg/dL (ref 0.0–40.0)

## 2021-07-23 LAB — MICROALBUMIN / CREATININE URINE RATIO
Creatinine,U: 189.7 mg/dL
Microalb Creat Ratio: 1.7 mg/g (ref 0.0–30.0)
Microalb, Ur: 3.2 mg/dL — ABNORMAL HIGH (ref 0.0–1.9)

## 2021-07-23 LAB — HEMOGLOBIN A1C: Hgb A1c MFr Bld: 7.3 % — ABNORMAL HIGH (ref 4.6–6.5)

## 2021-07-23 MED ORDER — AMLODIPINE-VALSARTAN-HCTZ 5-160-25 MG PO TABS: 1.0000 | ORAL_TABLET | Freq: Every day | ORAL | 2 refills | Status: DC

## 2021-07-23 NOTE — Patient Instructions (Addendum)
Give [place] 2-3 business days to get the results of your labs back.  ? ?Keep the diet clean and stay active. ? ?If you do not hear anything about your referral in the next 1-2 weeks, call our office and ask for an update. If your GI doc doesn't manage this, let me know and I will send a referral to the general surgery team.  ? ?Advanced directive form provided today.  ? ?Let us know if you need anything. ?

## 2021-07-23 NOTE — Progress Notes (Signed)
Chief Complaint  ?Patient presents with  ? Annual Exam  ?  ? ?Well Woman ?Kaitlyn Stevens is here for a complete physical.   ?Her last physical was >1 year ago.  ?Current diet: in general, a "healthy" diet. ?Current exercise: walking. ?Weight is intentionally decreasing and she denies fatigue out of ordinary. ?Seatbelt? Yes ?Advanced directive? No ? ?Health Maintenance ?Pap/HPV- Yes ?Mammogram- Scheduling w GYN ?CCS- No ?Tetanus- Yes ?Hep C screening- No ?HIV screening- Yes ? ?Past Medical History:  ?Diagnosis Date  ? Essential hypertension   ? History of blood transfusion 1996  ? with c/s done in [place]  ? SVD (spontaneous vaginal delivery) 1994  ? twins  ?  ? ?Past Surgical History:  ?Procedure Laterality Date  ? [place]  ? x 1 twins  ? CHOLECYSTECTOMY N/A 07/16/2017  ? Procedure: LAPAROSCOPIC CHOLECYSTECTOMY;  Surgeon: Stark Klein, MD;  Location: [facility];  Service: General;  Laterality: N/A;  ? POLYPECTOMY  02/13/2012  ? Procedure: POLYPECTOMY;  Surgeon: Sharene Butters, MD;  Location: [facility];  Service: Gynecology;  Laterality: N/A;  ? TUBAL LIGATION    ? ? ?Medications  ?Current Outpatient Medications on File Prior to Visit  ?Medication Sig Dispense Refill  ? acetaminophen (TYLENOL) 500 MG tablet Take 1 tablet (500 mg total) by mouth every 6 (six) hours as needed for mild pain or headache. 30 tablet 0  ? atorvastatin (LIPITOR) 40 MG tablet TAKE 1 TABLET BY MOUTH EVERY DAY 90 tablet 3  ? hydrocortisone (ANUSOL-HC) 2.5 % rectal cream Place 1 application rectally 2 (two) times daily. 30 g 0  ? loratadine (CLARITIN) 10 MG tablet Take 1 tablet (10 mg total) by mouth daily. 30 tablet 11  ? polyethylene glycol powder (MIRALAX) powder Take 17 g by mouth daily. 255 g 0  ? ?Allergies ?No Known Allergies ? ?Review of Systems: ?Constitutional:  no unexpected weight changes ?Eye:  no recent significant change in vision ?Ear/Nose/Mouth/Throat:  Ears:  no recent change in hearing ?Nose/Mouth/Throat:  no complaints of  nasal congestion, no sore throat ?Cardiovascular: no chest pain ?Respiratory:  no shortness of breath ?Gastrointestinal:  no abdominal pain, no change in bowel habits ?GU:  Female: negative for dysuria or pelvic pain ?Musculoskeletal/Extremities:  no pain of the joints ?Integumentary (Skin/Breast):  no abnormal skin lesions reported ?Neurologic:  no headaches ?Endocrine:  denies fatigue ?Hematologic/Lymphatic:  No areas of easy bleeding ? ?Exam ?BP 138/80   Pulse 91   Temp 98 ?F (36.7 ?C) (Oral)   Ht 5' 4"  (1.626 m)   Wt 201 lb (91.2 kg)   SpO2 97%   BMI 34.50 kg/m?  ?General:  well developed, well nourished, in no apparent distress ?Skin:  no significant moles, warts, or growths ?Head:  no masses, lesions, or tenderness ?Eyes:  pupils equal and round, sclera anicteric without injection ?Ears:  canals without lesions, TMs shiny without retraction, no obvious effusion, no erythema ?Nose:  nares patent, septum midline, mucosa normal, and no drainage or sinus tenderness ?Throat/Pharynx:  lips and gingiva without lesion; tongue and uvula midline; non-inflamed pharynx; no exudates or postnasal drainage ?Neck: neck supple without adenopathy, thyromegaly, or masses ?Lungs:  clear to auscultation, breath sounds equal bilaterally, no respiratory distress ?Cardio:  regular rate and rhythm, no LE edema ?Abdomen:  abdomen soft, nontender; bowel sounds normal; no masses or organomegaly ?Genital: Defer to GYN ?Rectal: Examined in presence of female chaperone: 2 large skin protrusions of rectal area that are not erythematous, draining,  fluctuant, bleeding.  ?Musculoskeletal:  symmetrical muscle groups noted without atrophy or deformity ?Extremities:  no clubbing, cyanosis, or edema, no deformities, no skin discoloration ?Neuro:  gait normal; deep tendon reflexes normal and symmetric ?Psych: well oriented with normal range of affect and appropriate judgment/insight ? ?Assessment and Plan ? ?Well adult exam - Plan: Lipid  panel, CBC, Comprehensive metabolic panel ? ?Diabetes mellitus type 2 in obese ([place]) - Plan: Microalbumin / creatinine urine ratio, Hemoglobin A1c ? ?Encounter for hepatitis C screening test for low risk patient - Plan: Hepatitis C antibody ? ?Screen for colon cancer - Plan: Ambulatory referral to Gastroenterology  ? ?Well 46 y.o. female. ?Counseled on diet and exercise. ?Advanced directive form provided today.  ?CCS- refer GI. ?+hemorrhoids, will see if Dr. Bryan Lemma can manage and if not, will refer to gen surg.  ?Other orders as above. ?Follow up in 6 mo or prn. ?The patient voiced understanding and agreement to the plan. ? ?[name], DO ?07/23/21 ?7:43 AM ? ?

## 2021-07-24 LAB — HEPATITIS C ANTIBODY
Hepatitis C Ab: NONREACTIVE
SIGNAL TO CUT-OFF: 0.05 (ref <1.00)

## 2021-08-29 ENCOUNTER — Telehealth: Payer: Self-pay | Admitting: Family Medicine

## 2021-08-29 NOTE — Telephone Encounter (Signed)
Pt called wanted Rx  patch for motion sickness  ?Going on a cruise. ?

## 2021-08-29 NOTE — Telephone Encounter (Signed)
Pt needed to discuss a rx. She did not want to go into detail.  ?

## 2021-08-30 MED ORDER — SCOPOLAMINE 1 MG/3DAYS TD PT72
1.0000 | MEDICATED_PATCH | TRANSDERMAL | 0 refills | Status: DC
Start: 2021-08-30 — End: 2021-10-22

## 2021-09-06 ENCOUNTER — Other Ambulatory Visit: Payer: Self-pay | Admitting: Family Medicine

## 2021-09-06 DIAGNOSIS — E1169 Type 2 diabetes mellitus with other specified complication: Secondary | ICD-10-CM

## 2021-10-22 ENCOUNTER — Ambulatory Visit (AMBULATORY_SURGERY_CENTER): Payer: No Typology Code available for payment source | Admitting: *Deleted

## 2021-10-22 VITALS — Ht 64.0 in | Wt 200.0 lb

## 2021-10-22 DIAGNOSIS — Z1211 Encounter for screening for malignant neoplasm of colon: Secondary | ICD-10-CM

## 2021-10-22 MED ORDER — NA SULFATE-K SULFATE-MG SULF 17.5-3.13-1.6 GM/177ML PO SOLN: 1.0000 | Freq: Once | ORAL | 0 refills | Status: AC

## 2021-10-22 NOTE — Progress Notes (Signed)
No egg or soy allergy known to patient  No issues known to pt with past sedation with any surgeries or procedures Patient denies ever being told they had issues or difficulty with intubation  No FH of Malignant Hyperthermia Pt is not on diet pills Pt is not on  home 02  Pt is not on blood thinners  Pt denies issues with constipation  No A fib or A flutter   NO PA's for preps discussed with pt In PV today  Discussed with pt there will be an out-of-pocket cost for prep and that varies from $0 to 70 +  dollars - pt verbalized understanding  Pt instructed to use Singlecare.com or GoodRx for a price reduction on prep   PV completed over the phone. Pt verified name, DOB, address and insurance during PV today.   Pt mailed instruction packet with copy of consent form to read and not return, and instructions.   Pt encouraged to call with questions or issues.  If pt has My chart, procedure instructions sent via My Chart  Insurance confirmed with pt at [facility] today

## 2021-10-23 ENCOUNTER — Ambulatory Visit (INDEPENDENT_AMBULATORY_CARE_PROVIDER_SITE_OTHER): Payer: No Typology Code available for payment source | Admitting: Family Medicine

## 2021-10-23 ENCOUNTER — Encounter: Payer: Self-pay | Admitting: Family Medicine

## 2021-10-23 VITALS — BP 120/80 | HR 77 | Temp 98.7°F | Ht 64.0 in | Wt 203.2 lb

## 2021-10-23 DIAGNOSIS — E1169 Type 2 diabetes mellitus with other specified complication: Secondary | ICD-10-CM | POA: Diagnosis not present

## 2021-10-23 DIAGNOSIS — E669 Obesity, unspecified: Secondary | ICD-10-CM | POA: Diagnosis not present

## 2021-10-23 DIAGNOSIS — J302 Other seasonal allergic rhinitis: Secondary | ICD-10-CM

## 2021-10-23 LAB — BASIC METABOLIC PANEL
BUN: 8 mg/dL (ref 6–23)
CO2: 27 mEq/L (ref 19–32)
Calcium: 9.3 mg/dL (ref 8.4–10.5)
Chloride: 100 mEq/L (ref 96–112)
Creatinine, Ser: 0.99 mg/dL (ref 0.40–1.20)
GFR: 67.75 mL/min (ref >60.00)
Glucose, Bld: 125 mg/dL — ABNORMAL HIGH (ref 70–99)
Potassium: 3.9 mEq/L (ref 3.5–5.1)
Sodium: 135 mEq/L (ref 135–145)

## 2021-10-23 LAB — HEMOGLOBIN A1C: Hgb A1c MFr Bld: 7.3 % — ABNORMAL HIGH (ref 4.6–6.5)

## 2021-10-23 MED ORDER — FLUTICASONE PROPIONATE 50 MCG/ACT NA SUSP: 2.0000 | Freq: Every day | NASAL | 2 refills | Status: DC

## 2021-10-23 NOTE — Progress Notes (Signed)
Subjective:   Chief Complaint  Patient presents with   Follow-up    DM    Kaitlyn Stevens is a 46 y.o. female here for follow-up of diabetes.   Ambry does not monitor her sugars at home.  Patient does not require insulin.   Medications include: diet controlled Diet is better.  Exercise: walking No Cp or SOB.   Allergies Seasonal allergies, takes Claritin prn. Works [employer]. She has never been on a nasal spray. No fevers or sick contacts.   Past Medical History:  Diagnosis Date   Allergy    Essential hypertension    Hemorrhoids    History of blood transfusion 1996   with c/s done in [place]   SVD (spontaneous vaginal delivery) 1994   twins     Related testing: Retinal exam: Done Pneumovax: done  Objective:  BP 120/80   Pulse 77   Temp 98.7 F (37.1 C) (Oral)   Ht 5' 4"  (1.626 m)   Wt 203 lb 4 oz (92.2 kg)   LMP 10/10/2021 (Approximate)   SpO2 94%   BMI 34.89 kg/m  General:  Well developed, well nourished, in no apparent distress Skin:  Warm, no pallor or diaphoresis Mouth: MMM Nose: Nares patent, turbinates pale b/l, no rhinorrhea Eyes:  Pupils equal and round, sclera anicteric without injection  Lungs:  CTAB, no access msc use Cardio:  RRR, no bruits, no LE edemaet Psych: Age appropriate judgment and insight  Assessment:   Diabetes mellitus type 2 in obese ([place]) - Plan: Hemoglobin L9J, Basic metabolic panel  Seasonal allergies - Plan: fluticasone (FLONASE) 50 MCG/ACT nasal spray   Plan:   Chronic, uncontrolled. On Lipitor for cardioprotection. Will either add Metformin XR 500 mg/d unless GFR is <60 and will then add Farxiga 10 mg/d. If A1c <7, will not add anything barring GFR change. Counseled on diet and exercise. Add INCS daily prn to Claritin.  F/u in 3-6 mo. The patient voiced understanding and agreement to the plan.  [place], DO 10/23/21 7:44 AM

## 2021-10-23 NOTE — Patient Instructions (Addendum)
Give [place] 2-3 business days to get the results of your labs back.   Keep the diet clean and stay active.  Claritin (loratadine), Allegra (fexofenadine), Zyrtec (cetirizine) which is also equivalent to Xyzal (levocetirizine); these are listed in order from weakest to strongest. Generic, and therefore cheaper, options are in the parentheses.   Flonase (fluticasone); nasal spray that is over the counter. 2 sprays each nostril, once daily. Aim towards the same side eye when you spray.  There are available OTC, and the generic versions, which may be cheaper, are in parentheses. Show this to a pharmacist if you have trouble finding any of these items.  Let us know if you need anything.

## 2021-11-11 ENCOUNTER — Encounter: Payer: Self-pay | Admitting: Gastroenterology

## 2021-11-19 ENCOUNTER — Other Ambulatory Visit: Payer: Self-pay

## 2021-11-19 ENCOUNTER — Other Ambulatory Visit (INDEPENDENT_AMBULATORY_CARE_PROVIDER_SITE_OTHER): Payer: No Typology Code available for payment source

## 2021-11-19 ENCOUNTER — Encounter: Payer: Self-pay | Admitting: Gastroenterology

## 2021-11-19 ENCOUNTER — Ambulatory Visit (AMBULATORY_SURGERY_CENTER): Payer: No Typology Code available for payment source | Admitting: Gastroenterology

## 2021-11-19 VITALS — BP 151/85 | HR 76 | Temp 97.9°F | Resp 16 | Ht 64.0 in | Wt 200.0 lb

## 2021-11-19 DIAGNOSIS — K56699 Other intestinal obstruction unspecified as to partial versus complete obstruction: Secondary | ICD-10-CM

## 2021-11-19 DIAGNOSIS — K5150 Left sided colitis without complications: Secondary | ICD-10-CM | POA: Diagnosis not present

## 2021-11-19 DIAGNOSIS — K529 Noninfective gastroenteritis and colitis, unspecified: Secondary | ICD-10-CM

## 2021-11-19 DIAGNOSIS — Z1211 Encounter for screening for malignant neoplasm of colon: Secondary | ICD-10-CM | POA: Diagnosis not present

## 2021-11-19 LAB — BASIC METABOLIC PANEL
BUN: 7 mg/dL (ref 6–23)
CO2: 28 mEq/L (ref 19–32)
Calcium: 9.1 mg/dL (ref 8.4–10.5)
Chloride: 100 mEq/L (ref 96–112)
Creatinine, Ser: 0.91 mg/dL (ref 0.40–1.20)
GFR: 74.92 mL/min (ref >60.00)
Glucose, Bld: 111 mg/dL — ABNORMAL HIGH (ref 70–99)
Potassium: 3.2 mEq/L — ABNORMAL LOW (ref 3.5–5.1)
Sodium: 138 mEq/L (ref 135–145)

## 2021-11-19 LAB — HIGH SENSITIVITY CRP: CRP, High Sensitivity: 10.59 mg/L — ABNORMAL HIGH (ref 0.000–5.000)

## 2021-11-19 LAB — SEDIMENTATION RATE: Sed Rate: 59 mm/hr — ABNORMAL HIGH (ref 0–20)

## 2021-11-19 MED ORDER — SODIUM CHLORIDE 0.9 % IV SOLN: 500.0000 mL | Freq: Once | INTRAVENOUS | Status: DC

## 2021-11-19 NOTE — Progress Notes (Signed)
VS by CW  Pt's states no medical or surgical changes since previsit or office visit.  

## 2021-11-19 NOTE — Progress Notes (Signed)
Patient sent for labwork.  Beth at Dr. Woodward Ku office called, message left that patient needs CT enterography and to please schedule. SChaplin, RN,BSN

## 2021-11-19 NOTE — Progress Notes (Signed)
Report to PACU, RN, vss, BBS= Clear.  

## 2021-11-19 NOTE — Progress Notes (Signed)
[facility] History and Physical   Primary Care Physician:  [name], DO   Reason for Procedure:  Colorectal cancer screening  Plan:    Screening colonoscopy with possible interventions as needed     HPI: Kaitlyn Stevens is a very pleasant 46 y.o. female here for screening colonoscopy. Denies any nausea, vomiting, abdominal pain, melena or bright red blood per rectum  The risks and benefits as well as alternatives of endoscopic procedure(s) have been discussed and reviewed. All questions answered. The patient agrees to proceed.    Past Medical History:  Diagnosis Date   Allergy    Essential hypertension    Hemorrhoids    History of blood transfusion 1996   with c/s done in [place]   SVD (spontaneous vaginal delivery) 1994   twins    Past Surgical History:  Procedure Laterality Date   CESAREAN SECTION  05/12/1994   x 1 twins   CHOLECYSTECTOMY N/A 07/16/2017   Procedure: LAPAROSCOPIC CHOLECYSTECTOMY;  Surgeon: Stark Klein, MD;  Location: [facility];  Service: General;  Laterality: N/A;   NSVD     1st set of twins vaginal delivery   POLYPECTOMY  02/13/2012   Procedure:uterine  POLYPECTOMY with Ablation ;  Surgeon: Sharene Butters, MD;  Location: [facility];  Service: Gynecology;  Laterality: N/A;   TUBAL LIGATION      Prior to Admission medications   Medication Sig Start Date End Date Taking? Authorizing Provider  amLODIPine-Valsartan-HCTZ 5-160-25 MG TABS Take 1 tablet by mouth daily. 07/23/21  Yes [name], DO  atorvastatin (LIPITOR) 40 MG tablet TAKE 1 TABLET BY MOUTH EVERY DAY 09/06/21  Yes [name], DO  fluticasone [facility]) 50 MCG/ACT nasal spray Place 2 sprays into both nostrils daily. 10/23/21   Wendling, Crosby Oyster, DO  hydrocortisone (ANUSOL-HC) 2.5 % rectal cream Place 1 application rectally 2 (two) times daily. 05/01/21   [name], DO  loratadine (CLARITIN) 10 MG tablet Take 1 tablet (10 mg total)  by mouth daily. Patient taking differently: Take 10 mg by mouth daily as needed. 09/28/17   Debbrah Alar, NP    Current Outpatient Medications  Medication Sig Dispense Refill   amLODIPine-Valsartan-HCTZ 5-160-25 MG TABS Take 1 tablet by mouth daily. 90 tablet 2   atorvastatin (LIPITOR) 40 MG tablet TAKE 1 TABLET BY MOUTH EVERY DAY 90 tablet 3   fluticasone (FLONASE) 50 MCG/ACT nasal spray Place 2 sprays into both nostrils daily. 16 g 2   hydrocortisone (ANUSOL-HC) 2.5 % rectal cream Place 1 application rectally 2 (two) times daily. 30 g 0   loratadine (CLARITIN) 10 MG tablet Take 1 tablet (10 mg total) by mouth daily. (Patient taking differently: Take 10 mg by mouth daily as needed.) 30 tablet 11   Current Facility-Administered Medications  Medication Dose Route Frequency Provider Last Rate Last Admin   0.9 %  sodium chloride infusion  500 mL Intravenous Once Mauri Pole, MD        Allergies as of 11/19/2021   (No Known Allergies)    Family History  Problem Relation Age of Onset   Colon polyps Mother    Diabetes Mother    Hypertension Mother    Congenital heart disease Paternal Grandmother    Colon cancer Neg Hx    Esophageal cancer Neg Hx    Rectal cancer Neg Hx    Stomach cancer Neg Hx     Social History   Socioeconomic History   Marital status: Single  Spouse name: Not on file   Number of children: Not on file   Years of education: Not on file   Highest education level: Not on file  Occupational History   Not on file  Tobacco Use   Smoking status: Never   Smokeless tobacco: Never  Vaping Use   Vaping Use: Never used  Substance and Sexual Activity   Alcohol use: No   Drug use: No   Sexual activity: Not on file  Other Topics Concern   Not on file  Social History Narrative   Not on file   Social Determinants of Health   Financial Resource Strain: Not on file  Food Insecurity: Not on file  Transportation Needs: Not on file  Physical  Activity: Not on file  Stress: Not on file  Social Connections: Not on file  Intimate Partner Violence: Not on file    Review of Systems:  All other review of systems negative except as mentioned in the HPI.  Physical Exam: Vital signs in last 24 hours: BP (!) 152/98   Pulse 79   Temp 97.9 F (36.6 C)   Ht 5' 4"  (1.626 m)   Wt 200 lb (90.7 kg)   LMP 10/10/2021 (Approximate)   SpO2 99%   BMI 34.33 kg/m  General:   Alert, NAD Lungs:  Clear .   Heart:  Regular rate and rhythm Abdomen:  Soft, nontender and nondistended. Neuro/Psych:  Alert and cooperative. Normal mood and affect. A and O x 3  Reviewed labs, radiology imaging, old records and pertinent past GI work up  Patient is appropriate for planned procedure(s) and anesthesia in an ambulatory setting   K. Denzil Magnuson , MD [phone]

## 2021-11-19 NOTE — Patient Instructions (Signed)
Labwork after colonoscopy today.  CT enterography needs to be scheduled.  Someone from the GI office will call you about this.  Await pathology results.  YOU HAD AN ENDOSCOPIC PROCEDURE TODAY AT [facility]:   Refer to the procedure report that was given to you for any specific questions about what was found during the examination.  If the procedure report does not answer your questions, please call your gastroenterologist to clarify.  If you requested that your care partner not be given the details of your procedure findings, then the procedure report has been included in a sealed envelope for you to review at your convenience later.  YOU SHOULD EXPECT: Some feelings of bloating in the abdomen. Passage of more gas than usual.  Walking can help get rid of the air that was put into your GI tract during the procedure and reduce the bloating. If you had a lower endoscopy (such as a colonoscopy or flexible sigmoidoscopy) you may notice spotting of blood in your stool or on the toilet paper. If you underwent a bowel prep for your procedure, you may not have a normal bowel movement for a few days.  Please Note:  You might notice some irritation and congestion in your nose or some drainage.  This is from the oxygen used during your procedure.  There is no need for concern and it should clear up in a day or so.  SYMPTOMS TO REPORT IMMEDIATELY:  Following lower endoscopy (colonoscopy or flexible sigmoidoscopy):  Excessive amounts of blood in the stool  Significant tenderness or worsening of abdominal pains  Swelling of the abdomen that is new, acute  Fever of 100F or higher   For urgent or emergent issues, a gastroenterologist can be reached at any hour by calling [phone]. Do not use MyChart messaging for urgent concerns.    DIET:  We do recommend a small meal at first, but then you may proceed to your regular diet.  Drink plenty of fluids but you should avoid alcoholic  beverages for 24 hours.  ACTIVITY:  You should plan to take it easy for the rest of today and you should NOT DRIVE or use heavy machinery until tomorrow (because of the sedation medicines used during the test).    FOLLOW UP: Our staff will call the number listed on your records the next business day following your procedure.  We will call around 7:15- 8:00 am to check on you and address any questions or concerns that you may have regarding the information given to you following your procedure. If we do not reach you, we will leave a message.  If you develop any symptoms (ie: fever, flu-like symptoms, shortness of breath, cough etc.) before then, please call [phone].  If you test positive for Covid 19 in the 2 weeks post procedure, please call and report this information to [place].    If any biopsies were taken you will be contacted by phone or by letter within the next 1-3 weeks.  Please call us at [phone] if you have not heard about the biopsies in 3 weeks.    SIGNATURES/CONFIDENTIALITY: You and/or your care partner have signed paperwork which will be entered into your electronic medical record.  These signatures attest to the fact that that the information above on your After Visit Summary has been reviewed and is understood.  Full responsibility of the confidentiality of this discharge information lies with you and/or your care-partner.

## 2021-11-19 NOTE — Progress Notes (Signed)
Called to room to assist during endoscopic procedure.  Patient ID and intended procedure confirmed with present staff. Received instructions for my participation in the procedure from the performing physician.  

## 2021-11-19 NOTE — Op Note (Signed)
[name] Procedure Date: 11/19/2021 2:50 PM MRN: [id number] Endoscopist: Mauri Pole , MD Age: 46 Referring MD:  Date of Birth: 10-10-1975 Gender: Female Account #: [id number] Procedure:                Colonoscopy Indications:              Screening for colorectal malignant neoplasm Medicines:                Monitored Anesthesia Care Procedure:                Pre-Anesthesia Assessment:                           - Prior to the procedure, a History and Physical                            was performed, and patient medications and                            allergies were reviewed. The patient's tolerance of                            previous anesthesia was also reviewed. The risks                            and benefits of the procedure and the sedation                            options and risks were discussed with the patient.                            All questions were answered, and informed consent                            was obtained. Prior Anticoagulants: The patient has                            taken no previous anticoagulant or antiplatelet                            agents. ASA Grade Assessment: II - A patient with                            mild systemic disease. After reviewing the risks                            and benefits, the patient was deemed in                            satisfactory condition to undergo the procedure.                           After obtaining informed consent, the colonoscope  was passed under direct vision. Throughout the                            procedure, the patient's blood pressure, pulse, and                            oxygen saturations were monitored continuously. The                            Olympus PCF-H190DL (#[id number]) Colonoscope was                            introduced through the anus with the intention of                            advancing to  the cecum. The scope was advanced to                            the ascending colon before the procedure was                            aborted. Medications were given. The colonoscopy                            was technically difficult and complex due to bowel                            stenosis. The patient tolerated the procedure well.                            The quality of the bowel preparation was good. The                            ileocecal valve, appendiceal orifice, and rectum                            were photographed. Scope In: 3:00:47 PM Scope Out: 3:17:52 PM Scope Withdrawal Time: 0 hours 11 minutes 33 seconds  Total Procedure Duration: 0 hours 17 minutes 5 seconds  Findings:                 The perianal and digital rectal examinations were                            normal.                           A benign-appearing, intrinsic severe stenosis                            measuring 7 mm (inner diameter) was found in the                            ascending colon and was non-traversed. Biopsies  were taken with a cold forceps for histology.                           Discontinuous areas of ulcerated mucosa with                            stigmata of recent bleeding were present in the                            rectum, in the sigmoid colon, in the descending                            colon and in the ascending colon. Biopsies were                            taken with a cold forceps for histology.                           Anal papilla(e) were hypertrophied. Complications:            No immediate complications. Estimated Blood Loss:     Estimated blood loss was minimal. 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 Biopsied.                           - Mucosal ulceration. Biopsied.                           - Anal papilla(e) were hypertrophied. Recommendation:           - Patient has a contact number available for                             emergencies. The signs and symptoms of potential                            delayed complications were discussed with the                            patient. Return to normal activities tomorrow.                            Written discharge instructions were provided to the                            patient.                           - Resume previous diet.                           - Continue present medications.                           - Await pathology results.                           -  Repeat colonoscopy in 1 year for surveillance                            based on pathology results.                           - Return to GI clinic at the next available                            appointment.                           - Schedule CT enterography next available                            appointment                           - Check BMP, Hep A, B Ab, B sAg, ESR and CRP Mauri Pole, MD 11/19/2021 3:28:29 PM This report has been signed electronically.

## 2021-11-20 ENCOUNTER — Telehealth: Payer: Self-pay | Admitting: *Deleted

## 2021-11-20 LAB — HEPATITIS A ANTIBODY, TOTAL: Hepatitis A AB,Total: NONREACTIVE

## 2021-11-20 LAB — HEPATITIS B SURFACE ANTIGEN: Hepatitis B Surface Ag: NONREACTIVE

## 2021-11-20 LAB — HEPATITIS B SURFACE ANTIBODY,QUALITATIVE: Hep B S Ab: NONREACTIVE

## 2021-11-20 NOTE — Telephone Encounter (Signed)
  Follow up Call-     11/19/2021    2:30 PM  Call back number  Post procedure Call Back phone  # [phone]  Permission to leave phone message Yes     Patient questions:  Do you have a fever, pain , or abdominal swelling? No. Pain Score  0 *  Have you tolerated food without any problems? Yes.    Have you been able to return to your normal activities? Yes.    Do you have any questions about your discharge instructions: Diet   No. Medications  No. Follow up visit  No.  Do you have questions or concerns about your Care? No.  Actions: * If pain score is 4 or above: No action needed, pain <4.

## 2021-11-21 ENCOUNTER — Telehealth: Payer: Self-pay

## 2021-11-21 NOTE — Telephone Encounter (Signed)
CT enterography ordered. Staff message sent to the Radiology Scheduler team.

## 2021-12-02 ENCOUNTER — Ambulatory Visit (HOSPITAL_COMMUNITY): Payer: No Typology Code available for payment source

## 2021-12-03 ENCOUNTER — Encounter: Payer: Self-pay | Admitting: Gastroenterology

## 2021-12-04 ENCOUNTER — Other Ambulatory Visit: Payer: Self-pay

## 2021-12-04 DIAGNOSIS — K529 Noninfective gastroenteritis and colitis, unspecified: Secondary | ICD-10-CM

## 2021-12-04 DIAGNOSIS — K56699 Other intestinal obstruction unspecified as to partial versus complete obstruction: Secondary | ICD-10-CM

## 2021-12-05 ENCOUNTER — Telehealth: Payer: Self-pay | Admitting: Gastroenterology

## 2021-12-05 NOTE — Telephone Encounter (Signed)
Holland Falling called to correct CT order please call [place] [phone]

## 2021-12-06 NOTE — Telephone Encounter (Signed)
Left message on machine to call back  

## 2021-12-10 ENCOUNTER — Telehealth: Payer: Self-pay | Admitting: Family Medicine

## 2021-12-10 NOTE — Telephone Encounter (Signed)
-----   Message from Sheffield Slider sent at 12/10/2021  1:35 PM EDT ----- [name]  This is a duplicate order  Its been approved  Authorization Number: [id number] Case Number: [id number]    Patient Name: Kaitlyn Stevens, Kaitlyn Stevens DOB: February 01, 1976 Status: Approved P2P Status:  Approval Date: 11/25/2021 10:23:52 AM Service Code: [phone] Service Description: [place] [facility]/ Site Name: [facility] Start Date: 11/25/2021 Expiration Date: 05/24/2022 Date Last Updated: 12/02/2021 3:47:06 PM Correspondence:  ----- Message ----- From: Greggory Keen, LPN Sent: 0/06/7251  66:44 AM EDT To: Sheffield Slider  This patient is to have a CT entero at a [facility] site in [place]. When do you think it will be authorized?

## 2021-12-10 NOTE — Telephone Encounter (Signed)
Called the patient and she was diagnosed with crohn's disease per GI  She wants to discuss with PCP. Scheduled OV with PCP to discuss.

## 2021-12-10 NOTE — Telephone Encounter (Signed)
Patient would like a call back to discuss a diagnosis she received but wishes to "only discuss with Dr. Nani Ravens or his nurse." Please call to discuss at [phone]

## 2021-12-16 ENCOUNTER — Ambulatory Visit: Payer: No Typology Code available for payment source | Admitting: Family Medicine

## 2021-12-17 NOTE — Telephone Encounter (Signed)
Called [facility] in [place] and confirmed the CT entero Abd/pelvis with contrast order was received. The imaging center has received it. They have called the patient and are still waiting for her to call back and schedule. Spoke with the patient. She will call [facility] in [place] to set up her appointment. Spoke with [facility] Radiology Scheduling and canceled the appointment the patient had for 12/19/21 as she requests.

## 2021-12-18 ENCOUNTER — Encounter: Payer: Self-pay | Admitting: Family Medicine

## 2021-12-18 ENCOUNTER — Ambulatory Visit (INDEPENDENT_AMBULATORY_CARE_PROVIDER_SITE_OTHER): Payer: No Typology Code available for payment source | Admitting: Family Medicine

## 2021-12-18 VITALS — BP 138/86 | HR 76 | Temp 98.5°F | Ht 64.0 in | Wt 203.2 lb

## 2021-12-18 DIAGNOSIS — E1169 Type 2 diabetes mellitus with other specified complication: Secondary | ICD-10-CM | POA: Diagnosis not present

## 2021-12-18 DIAGNOSIS — K5010 Crohn's disease of large intestine without complications: Secondary | ICD-10-CM

## 2021-12-18 DIAGNOSIS — E669 Obesity, unspecified: Secondary | ICD-10-CM | POA: Diagnosis not present

## 2021-12-18 MED ORDER — AMLODIPINE-VALSARTAN-HCTZ 5-160-25 MG PO TABS
1.0000 | ORAL_TABLET | Freq: Every day | ORAL | 2 refills | Status: DC
Start: 2021-12-18 — End: 2022-01-17

## 2021-12-18 NOTE — Progress Notes (Signed)
Chief Complaint  Patient presents with   Results    Diagnosed with Crohn's Disease     Subjective: Patient is a 46 y.o. female here for recent dx of Crohn's.  Patient was incidentally diagnosed with Crohn's disease on a screening colonoscopy.  She has never had any significant symptoms of bleeding.  Her weight will fluctuate but no weight loss associated with GI symptoms.  No known family history of Crohn's disease or ulcerative colitis.  She denies any oral lesions.  She was not placed on prednisone due to being asymptomatic.  A CT scan was ordered by her gastroenterologist but she has not had it done due to insurance issues.  She has a follow-up with her gastroenterologist in a little over 2 weeks.  Her A1c was not controlled at her last visit.  She is wondering if she needs to start metformin but would prefer to hold off until her follow-up visit in September.  Diet is fair.  Past Medical History:  Diagnosis Date   Allergy    Essential hypertension    Hemorrhoids    History of blood transfusion 1996   with c/s done in [place]   SVD (spontaneous vaginal delivery) 1994   twins    Objective: BP 138/86   Pulse 76   Temp 98.5 F (36.9 C) (Oral)   Ht 5' 4"  (1.626 m)   Wt 203 lb 4 oz (92.2 kg)   SpO2 94%   BMI 34.89 kg/m  General: Awake, appears stated age Lungs: No accessory muscle use Psych: Age appropriate judgment and insight, normal affect and mood  Assessment and Plan: Crohn's disease of colon without complication ([place])  Diabetes mellitus type 2 in obese [facility])   I reviewed her recent scope and pictures and answered her questions to the best of my ability.  Fortunately she is not having symptoms.  Appreciate gastroenterology input. For her diabetes, I did offer metformin but she would prefer to hold off until her follow-up visit.  Counseled diet and exercise. She politely declined a tetanus shot today but will consider getting it next month. The patient voiced  understanding and agreement to the plan.  I spent 35 minutes with the patient discussing the above plans in addition to reviewing her chart on the same day of the visit.  [place], DO 12/18/21  8:34 AM

## 2021-12-19 ENCOUNTER — Other Ambulatory Visit (HOSPITAL_COMMUNITY): Payer: No Typology Code available for payment source

## 2022-01-03 ENCOUNTER — Ambulatory Visit: Payer: No Typology Code available for payment source | Admitting: Physician Assistant

## 2022-01-03 ENCOUNTER — Telehealth: Payer: Self-pay

## 2022-01-03 ENCOUNTER — Ambulatory Visit: Payer: No Typology Code available for payment source | Admitting: Gastroenterology

## 2022-01-03 NOTE — Telephone Encounter (Signed)
Canceled follow up appointment.

## 2022-01-17 ENCOUNTER — Telehealth: Payer: Self-pay

## 2022-01-17 MED ORDER — AMLODIPINE-VALSARTAN-HCTZ 5-160-25 MG PO TABS: 1.0000 | ORAL_TABLET | Freq: Every day | ORAL | 2 refills | Status: DC

## 2022-01-17 NOTE — Telephone Encounter (Signed)
Caller Name Center Phone Number [phone] Patient Name Kaitlyn Stevens Patient DOB October 24, 1975 Call Type Message Only Information Provided Reason for Call Request for General Office Information Initial Comment Caller is asking for a return call from the nurse. Disp. Time Disposition Final User 01/17/2022 8:01:12 AM General Information Provided Yes Bettye Boeck Call Closed By: Bettye Boeck Transaction Date/Time: 01/17/2022 7:59:39 AM (ET)

## 2022-01-17 NOTE — Telephone Encounter (Signed)
Called the patient and no answer/no VM to leave a msg.

## 2022-01-17 NOTE — Telephone Encounter (Signed)
Called the patient and resent the prescription She will call back if they did not receive it.

## 2022-01-17 NOTE — Telephone Encounter (Signed)
Pt was just needing amLODIPine-Valsartan-HCTZ 5-160-25 MG TABS. Advised her this was at the pharmacy. She stated she would still like Robin to give her a call back.

## 2022-01-29 ENCOUNTER — Ambulatory Visit: Payer: No Typology Code available for payment source | Admitting: Family Medicine

## 2022-01-31 ENCOUNTER — Other Ambulatory Visit (INDEPENDENT_AMBULATORY_CARE_PROVIDER_SITE_OTHER): Payer: No Typology Code available for payment source

## 2022-01-31 ENCOUNTER — Ambulatory Visit (INDEPENDENT_AMBULATORY_CARE_PROVIDER_SITE_OTHER): Payer: No Typology Code available for payment source | Admitting: Family Medicine

## 2022-01-31 ENCOUNTER — Encounter: Payer: Self-pay | Admitting: Family Medicine

## 2022-01-31 VITALS — BP 128/88 | HR 84 | Temp 98.0°F | Ht 64.0 in | Wt 202.0 lb

## 2022-01-31 DIAGNOSIS — E119 Type 2 diabetes mellitus without complications: Secondary | ICD-10-CM | POA: Diagnosis not present

## 2022-01-31 DIAGNOSIS — E1169 Type 2 diabetes mellitus with other specified complication: Secondary | ICD-10-CM | POA: Diagnosis not present

## 2022-01-31 DIAGNOSIS — E669 Obesity, unspecified: Secondary | ICD-10-CM | POA: Diagnosis not present

## 2022-01-31 DIAGNOSIS — I10 Essential (primary) hypertension: Secondary | ICD-10-CM

## 2022-01-31 NOTE — Progress Notes (Signed)
Subjective:   Chief Complaint  Patient presents with   Diabetes    3 m f/u     Kaitlyn Stevens is a 46 y.o. female here for follow-up of diabetes.   Kaitlyn Stevens does not routinely monitor her sugars Patient does not require insulin.   Medications include: diet controlled Diet is not healthy.  Exercise: walking No CP or SOB.   Hypertension Patient presents for hypertension follow up. She does monitor home blood pressures. Blood pressures ranging on average from 120's/70-80's. She is compliant with medications- Exforge HCT- 5-160-25 mg/d. Patient has these side effects of medication: none Diet/exercise as above.   Past Medical History:  Diagnosis Date   Allergy    Essential hypertension    Hemorrhoids    History of blood transfusion 1996   with c/s done in [place]   SVD (spontaneous vaginal delivery) 1994   twins     Related testing: Retinal exam: Done Pneumovax: done  Objective:  BP 128/88 (BP Location: Left Arm, Cuff Size: Normal)   Pulse 84   Temp 98 F (36.7 C) (Oral)   Ht 5' 4"  (1.626 m)   Wt 202 lb (91.6 kg)   LMP 01/13/2022   SpO2 97%   BMI 34.67 kg/m  General:  Well developed, well nourished, in no apparent distress Skin:  Warm, no pallor or diaphoresis on exposed skin surfaces Lungs:  CTAB, no access msc use Cardio:  RRR, no bruits, no LE edema Psych: Age appropriate judgment and insight  Assessment:   Diabetes mellitus type 2 in obese ([place]) - Plan: Hemoglobin A1c  Essential hypertension   Plan:   Chronic, uncontrolled. Cont Lipitor 40 mg/d. Monitor sugars at home. Start metformin XR 500 mg/d. Counseled on diet and exercise. Chronic, I think stable. Cont Exforge - HCT 5-160-25 mg/d. Monitor BP at home.  F/u in 3-6 mo. The patient voiced understanding and agreement to the plan.  [place], DO 01/31/22 7:20 AM

## 2022-01-31 NOTE — Patient Instructions (Addendum)
Give [place] 2-3 business days to get the results of your labs back.   Keep the diet clean and stay active.  I recommend getting the flu shot in mid October. This suggestion would change if the CDC comes out with a different recommendation.   Please consider getting the tetanus shot.   For the next few weeks, check your blood pressures 2-3 times per week, alternating the time of day you check it. If it is high, considering waiting 1-2 minutes and rechecking. If it gets higher, your anxiety is likely creeping up and we should avoid rechecking.   Let us know if you need anything.

## 2022-02-03 LAB — HEMOGLOBIN A1C: Hgb A1c MFr Bld: 7.3 % — ABNORMAL HIGH (ref 4.6–6.5)

## 2022-03-12 ENCOUNTER — Other Ambulatory Visit (INDEPENDENT_AMBULATORY_CARE_PROVIDER_SITE_OTHER): Payer: No Typology Code available for payment source

## 2022-03-12 ENCOUNTER — Ambulatory Visit (INDEPENDENT_AMBULATORY_CARE_PROVIDER_SITE_OTHER): Payer: No Typology Code available for payment source | Admitting: Gastroenterology

## 2022-03-12 ENCOUNTER — Encounter: Payer: Self-pay | Admitting: Gastroenterology

## 2022-03-12 VITALS — BP 150/100 | HR 80 | Ht 65.0 in | Wt 199.2 lb

## 2022-03-12 DIAGNOSIS — K5010 Crohn's disease of large intestine without complications: Secondary | ICD-10-CM | POA: Diagnosis not present

## 2022-03-12 DIAGNOSIS — K529 Noninfective gastroenteritis and colitis, unspecified: Secondary | ICD-10-CM

## 2022-03-12 DIAGNOSIS — K6289 Other specified diseases of anus and rectum: Secondary | ICD-10-CM | POA: Diagnosis not present

## 2022-03-12 DIAGNOSIS — K625 Hemorrhage of anus and rectum: Secondary | ICD-10-CM | POA: Diagnosis not present

## 2022-03-12 LAB — CBC WITH DIFFERENTIAL/PLATELET
Basophils Absolute: 0 10*3/uL (ref 0.0–0.1)
Basophils Relative: 0.4 % (ref 0.0–3.0)
Eosinophils Absolute: 0.2 10*3/uL (ref 0.0–0.7)
Eosinophils Relative: 4.3 % (ref 0.0–5.0)
HCT: 29.2 % — ABNORMAL LOW (ref 36.0–46.0)
Hemoglobin: 9.3 g/dL — ABNORMAL LOW (ref 12.0–15.0)
Lymphocytes Relative: 43.7 % (ref 12.0–46.0)
Lymphs Abs: 2.5 10*3/uL (ref 0.7–4.0)
MCHC: 31.7 g/dL (ref 30.0–36.0)
MCV: 73.6 fl — ABNORMAL LOW (ref 78.0–100.0)
Monocytes Absolute: 0.6 10*3/uL (ref 0.1–1.0)
Monocytes Relative: 9.9 % (ref 3.0–12.0)
Neutro Abs: 2.3 10*3/uL (ref 1.4–7.7)
Neutrophils Relative %: 41.7 % — ABNORMAL LOW (ref 43.0–77.0)
Platelets: 448 10*3/uL — ABNORMAL HIGH (ref 150.0–400.0)
RBC: 3.97 Mil/uL (ref 3.87–5.11)
RDW: 18.2 % — ABNORMAL HIGH (ref 11.5–15.5)
WBC: 5.6 10*3/uL (ref 4.0–10.5)

## 2022-03-12 LAB — COMPREHENSIVE METABOLIC PANEL
ALT: 12 U/L (ref 0–35)
AST: 18 U/L (ref 0–37)
Albumin: 4.1 g/dL (ref 3.5–5.2)
Alkaline Phosphatase: 79 U/L (ref 39–117)
BUN: 12 mg/dL (ref 6–23)
CO2: 28 mEq/L (ref 19–32)
Calcium: 9 mg/dL (ref 8.4–10.5)
Chloride: 103 mEq/L (ref 96–112)
Creatinine, Ser: 0.86 mg/dL (ref 0.40–1.20)
GFR: 80.01 mL/min (ref >60.00)
Glucose, Bld: 111 mg/dL — ABNORMAL HIGH (ref 70–99)
Potassium: 3.5 mEq/L (ref 3.5–5.1)
Sodium: 136 mEq/L (ref 135–145)
Total Bilirubin: 0.4 mg/dL (ref 0.2–1.2)
Total Protein: 8.6 g/dL — ABNORMAL HIGH (ref 6.0–8.3)

## 2022-03-12 LAB — HIGH SENSITIVITY CRP: CRP, High Sensitivity: 3.46 mg/L (ref 0.000–5.000)

## 2022-03-12 LAB — VITAMIN B12: Vitamin B-12: 334 pg/mL (ref 211–911)

## 2022-03-12 LAB — IBC + FERRITIN
Ferritin: 3.4 ng/mL — ABNORMAL LOW (ref 10.0–291.0)
Iron: 30 ug/dL — ABNORMAL LOW (ref 42–145)
Saturation Ratios: 6.1 % — ABNORMAL LOW (ref 20.0–50.0)
TIBC: 495.6 ug/dL — ABNORMAL HIGH (ref 250.0–450.0)
Transferrin: 354 mg/dL (ref 212.0–360.0)

## 2022-03-12 LAB — FOLATE: Folate: 14.1 ng/mL (ref >5.9)

## 2022-03-12 MED ORDER — HYDROCORTISONE ACETATE 25 MG RE SUPP: 25.0000 mg | Freq: Every day | RECTAL | 1 refills | Status: DC

## 2022-03-12 NOTE — Patient Instructions (Addendum)
_______________________________________________________  If you are age 48 or older, your body mass index should be between 23-30. Your Body mass index is 33.15 kg/m. If this is out of the aforementioned range listed, please consider follow up with your Primary Care Provider.  If you are age 34 or younger, your body mass index should be between 19-25. Your Body mass index is 33.15 kg/m. If this is out of the aformentioned range listed, please consider follow up with your Primary Care Provider.   ________________________________________________________  The [place] providers would like to encourage you to use [facility] to communicate with providers for non-urgent requests or questions.  Due to long hold times on the telephone, sending your provider a message by [facility] may be a faster and more efficient way to get a response.  Please allow 48 business hours for a response.  Please remember that this is for non-urgent requests.  _______________________________________________________  We have sent the following medications to your pharmacy for you to pick up at your convenience:  Anusol HC Suppositories - bedtime for 2 weeks.  Your provider has requested that you go to the basement level for lab work before leaving today. Press "B" on the elevator. The lab is located at the first door on the left as you exit the elevator  You have been scheduled for a CT scan of the abdomen and pelvis at [facility], 1st floor Radiology. You are scheduled on 03/17/2022 at 10:00am. You should arrive 2 hours prior to your appointment time for registration.   Please follow the written instructions below on the day of your exam:   1) Do not eat anything after 6:00am (4 hours prior to your test)    You may take any medications as prescribed with a small amount of water, if necessary. If you take any of the following medications: METFORMIN, GLUCOPHAGE, GLUCOVANCE, AVANDAMET, RIOMET, FORTAMET, [place] MET,  JANUMET, GLUMETZA or METAGLIP, you MAY be asked to HOLD this medication 48 hours AFTER the exam.   The purpose of you drinking the oral contrast is to aid in the visualization of your intestinal tract. The contrast solution may cause some diarrhea. Depending on your individual set of symptoms, you may also receive an intravenous injection of x-ray contrast/dye. Plan on being at [facility] for 45 minutes or longer, depending on the type of exam you are having performed.   If you have any questions regarding your exam or if you need to reschedule, you may call [facility] at [phone] between the hours of 8:00 am and 5:00 pm, Monday-Friday.    Please follow up in 3 months

## 2022-03-12 NOTE — Progress Notes (Signed)
Kaitlyn Stevens    [id number]    Sep 21, 1975  Primary Care Physician:Wendling, Crosby Oyster, DO  Referring Physician: [name], [address] [place],  [place] [address]   Chief complaint:  Crohn's disease   HPI:  46 year old very pleasant female here for follow-up visit for recent diagnosis of inflammatory bowel disease/Crohn's disease  She had incidental findings of ascending colon stricture and colitis during routine colonoscopy for colorectal cancer screening.  She is relatively asymptomatic, has occasional abdominal bloating, discomfort, change in bowel habits but denies any persistent symptoms.  No rectal bleeding or blood in stool. She remains hesitant to start any new medications  Colonoscopy 11/19/21 - Stricture in the ascending colon. Biopsied. - Mucosal ulceration. Biopsied. - Anal papilla(e) were hypertrophied.  1. Surgical [P], ascending colon stricture - MILDLY ACTIVE CHRONIC COLITIS WITH AN ISOLATED NONNECROTIZING MICROGRANULOMA, SEE NOTE - NEGATIVE FOR DYSPLASIA 2. Surgical [P], random right colon - COLONIC MUCOSA WITH NO SPECIFIC HISTOPATHOLOGIC CHANGES, SEE NOTE - NEGATIVE FOR ACUTE INFLAMMATION, FEATURES OF CHRONICITY, GRANULOMAS OR DYSPLASIA 3. Surgical [P], random left colon - PATCHY MILD TO MODERATELY ACTIVE CHRONIC COLITIS, SEE NOTE - NEGATIVE FOR GRANULOMAS OR DYSPLASIA   Outpatient Encounter Medications as of 03/12/2022  Medication Sig   amLODIPine-Valsartan-HCTZ 5-160-25 MG TABS Take 1 tablet by mouth daily.   atorvastatin (LIPITOR) 40 MG tablet TAKE 1 TABLET BY MOUTH EVERY DAY   fluticasone (FLONASE) 50 MCG/ACT nasal spray Place 2 sprays into both nostrils daily.   hydrocortisone (ANUSOL-HC) 2.5 % rectal cream Place 1 application rectally 2 (two) times daily.   loratadine (CLARITIN) 10 MG tablet Take 1 tablet (10 mg total) by mouth daily. (Patient taking differently: Take 10 mg by mouth daily as needed.)    No facility-administered encounter medications on file as of 03/12/2022.    Allergies as of 03/12/2022   (No Known Allergies)    Past Medical History:  Diagnosis Date   Allergy    Essential hypertension    Hemorrhoids    History of blood transfusion 1996   with c/s done in [place]   SVD (spontaneous vaginal delivery) 1994   twins    Past Surgical History:  Procedure Laterality Date   CESAREAN SECTION  05/12/1994   x 1 twins   CHOLECYSTECTOMY N/A 07/16/2017   Procedure: LAPAROSCOPIC CHOLECYSTECTOMY;  Surgeon: Stark Klein, MD;  Location: [facility];  Service: General;  Laterality: N/A;   NSVD     1st set of twins vaginal delivery   POLYPECTOMY  02/13/2012   Procedure:uterine  POLYPECTOMY with Ablation ;  Surgeon: Sharene Butters, MD;  Location: [facility];  Service: Gynecology;  Laterality: N/A;   TUBAL LIGATION      Family History  Problem Relation Age of Onset   Colon polyps Mother    Diabetes Mother    Hypertension Mother    Congenital heart disease Paternal Grandmother    Colon cancer Neg Hx    Esophageal cancer Neg Hx    Rectal cancer Neg Hx    Stomach cancer Neg Hx     Social History   Socioeconomic History   Marital status: Single    Spouse name: Not on file   Number of children: Not on file   Years of education: Not on file   Highest education level: Not on file  Occupational History   Not on file  Tobacco Use   Smoking status: Never  Smokeless tobacco: Never  Vaping Use   Vaping Use: Never used  Substance and Sexual Activity   Alcohol use: No   Drug use: No   Sexual activity: Not on file  Other Topics Concern   Not on file  Social History Narrative   Not on file   Social Determinants of Health   Financial Resource Strain: Not on file  Food Insecurity: Not on file  Transportation Needs: Not on file  Physical Activity: Not on file  Stress: Not on file  Social Connections: Not on file  Intimate Partner Violence: Not on file      Review of  systems: All other review of systems negative except as mentioned in the HPI.   Physical Exam: Vitals:   03/12/22 1005  BP: (Abnormal) 150/100  Pulse: 80  SpO2: 98%   Body mass index is 33.15 kg/m. Gen:      No acute distress HEENT:  sclera anicteric Abd:      soft, non-tender; no palpable masses, no distension Ext:    No edema Neuro: alert and oriented x 3 Psych: normal mood and affect  Data Reviewed:  Reviewed labs, radiology imaging, old records and pertinent past GI work up   Assessment and Plan/Recommendations:  46 year old very pleasant female with new diagnosis of inflammatory bowel disease/Crohn's disease complicated by colonic stricture Patient remains hesitant to start any new medications or immunosuppressive therapy Symptomatic hemorrhoids: Use Anusol suppository at bedtime daily for 2 weeks  Schedule CT abdomen pelvis with contrast to evaluate the right side of colon and small bowel, exclude any mass lesions  Return in 2 months  This visit required >40 minutes of patient care (this includes precharting, chart review, review of results, face-to-face time used for counseling as well as treatment plan and follow-up. The patient was provided an opportunity to ask questions and all were answered. The patient agreed with the plan and demonstrated an understanding of the instructions.  Damaris Hippo , MD    CC: [name]*

## 2022-03-17 ENCOUNTER — Ambulatory Visit (HOSPITAL_COMMUNITY): Payer: No Typology Code available for payment source

## 2022-03-21 ENCOUNTER — Ambulatory Visit (HOSPITAL_COMMUNITY): Payer: No Typology Code available for payment source

## 2022-03-24 ENCOUNTER — Ambulatory Visit (HOSPITAL_COMMUNITY)
Admission: RE | Admit: 2022-03-24 | Discharge: 2022-03-24 | Disposition: A | Discharge status: Home / Self Care | Payer: No Typology Code available for payment source | Source: Ambulatory Visit | Attending: Gastroenterology | Admitting: Gastroenterology

## 2022-03-24 DIAGNOSIS — K529 Noninfective gastroenteritis and colitis, unspecified: Secondary | ICD-10-CM | POA: Diagnosis present

## 2022-03-24 DIAGNOSIS — K625 Hemorrhage of anus and rectum: Secondary | ICD-10-CM | POA: Insufficient documentation

## 2022-03-24 DIAGNOSIS — K6289 Other specified diseases of anus and rectum: Secondary | ICD-10-CM | POA: Insufficient documentation

## 2022-03-24 DIAGNOSIS — K5010 Crohn's disease of large intestine without complications: Secondary | ICD-10-CM | POA: Insufficient documentation

## 2022-03-24 MED ORDER — IOHEXOL 300 MG/ML  SOLN
100.0000 mL | Freq: Once | INTRAMUSCULAR | Status: AC | PRN
  Administered 2022-03-24: 100 mL via INTRAVENOUS

## 2022-03-24 MED ORDER — IOHEXOL 9 MG/ML PO SOLN
1000.0000 mL | Freq: Once | ORAL | Status: AC
  Administered 2022-03-24: 1000 mL via ORAL

## 2022-03-28 ENCOUNTER — Encounter: Payer: Self-pay | Admitting: Gastroenterology

## 2022-03-31 ENCOUNTER — Telehealth: Payer: Self-pay | Admitting: Family Medicine

## 2022-03-31 NOTE — Telephone Encounter (Signed)
Scheduled appointment for 04/01/22 at 3:15 video visit

## 2022-03-31 NOTE — Telephone Encounter (Signed)
Called the patient and she is having a cough. She has tested 3 times and does not have covid Just has a cough.

## 2022-03-31 NOTE — Telephone Encounter (Signed)
Pt requested Robin to give her a call. She did not want to say what it was regarding.

## 2022-04-01 ENCOUNTER — Encounter: Payer: Self-pay | Admitting: Family Medicine

## 2022-04-01 ENCOUNTER — Telehealth (INDEPENDENT_AMBULATORY_CARE_PROVIDER_SITE_OTHER): Payer: No Typology Code available for payment source | Admitting: Family Medicine

## 2022-04-01 DIAGNOSIS — J40 Bronchitis, not specified as acute or chronic: Secondary | ICD-10-CM

## 2022-04-01 MED ORDER — PREDNISONE 20 MG PO TABS: 40.0000 mg | ORAL_TABLET | Freq: Every day | ORAL | 0 refills | Status: AC

## 2022-04-01 NOTE — Progress Notes (Signed)
Chief Complaint  Patient presents with   Cough    Kaitlyn Stevens here for URI complaints. Due to COVID-19 pandemic, we are interacting via web portal for an electronic face-to-face visit. I verified patient's ID using 2 identifiers. Patient agreed to proceed with visit via this method. Patient is at home, I am at office. Patient and I are present for visit.   Duration: 7 days  Associated symptoms:  Coughing, wheezing Denies: sinus congestion, sinus pain, rhinorrhea, itchy watery eyes, ear pain, ear drainage, sore throat, shortness of breath, myalgia, and fevers Treatment to date: Coricidin, Flonase, Delsym Sick contacts: No Tested neg for covid x 3.   Past Medical History:  Diagnosis Date   Allergy    Essential hypertension    Hemorrhoids    History of blood transfusion 1996   with c/s done in [place]   SVD (spontaneous vaginal delivery) 1994   twins    Objective No conversational dyspnea Age appropriate judgment and insight Nml affect and mood  Wheezy bronchitis - Plan: predniSONE (DELTASONE) 20 MG tablet  5 d pred burst 40 mg/d. Has worked well in past. Continue to push fluids, practice good hand hygiene, cover mouth when coughing. Continue Delsym.  F/u prn. If starting to experience fevers, shaking, or shortness of breath, seek immediate care. Pt voiced understanding and agreement to the plan.  [place], DO 04/01/22 3:18 PM

## 2022-04-10 ENCOUNTER — Encounter: Payer: Self-pay | Admitting: Gastroenterology

## 2022-04-14 ENCOUNTER — Other Ambulatory Visit: Payer: Self-pay

## 2022-04-22 ENCOUNTER — Other Ambulatory Visit: Payer: Self-pay

## 2022-04-22 DIAGNOSIS — D500 Iron deficiency anemia secondary to blood loss (chronic): Secondary | ICD-10-CM

## 2022-05-14 ENCOUNTER — Other Ambulatory Visit: Payer: Self-pay | Admitting: Family Medicine

## 2022-05-14 ENCOUNTER — Encounter: Payer: Self-pay | Admitting: Family Medicine

## 2022-05-14 ENCOUNTER — Ambulatory Visit (INDEPENDENT_AMBULATORY_CARE_PROVIDER_SITE_OTHER): Payer: No Typology Code available for payment source | Admitting: Family Medicine

## 2022-05-14 VITALS — BP 112/68 | HR 62 | Temp 97.8°F | Ht 64.5 in | Wt 193.4 lb

## 2022-05-14 DIAGNOSIS — E786 Lipoprotein deficiency: Secondary | ICD-10-CM | POA: Diagnosis not present

## 2022-05-14 DIAGNOSIS — Z23 Encounter for immunization: Secondary | ICD-10-CM

## 2022-05-14 DIAGNOSIS — E1169 Type 2 diabetes mellitus with other specified complication: Secondary | ICD-10-CM | POA: Diagnosis not present

## 2022-05-14 DIAGNOSIS — I10 Essential (primary) hypertension: Secondary | ICD-10-CM

## 2022-05-14 DIAGNOSIS — E669 Obesity, unspecified: Secondary | ICD-10-CM

## 2022-05-14 LAB — COMPREHENSIVE METABOLIC PANEL
ALT: 11 U/L (ref 0–35)
AST: 15 U/L (ref 0–37)
Albumin: 3.8 g/dL (ref 3.5–5.2)
Alkaline Phosphatase: 79 U/L (ref 39–117)
BUN: 8 mg/dL (ref 6–23)
CO2: 25 mEq/L (ref 19–32)
Calcium: 9 mg/dL (ref 8.4–10.5)
Chloride: 102 mEq/L (ref 96–112)
Creatinine, Ser: 0.86 mg/dL (ref 0.40–1.20)
GFR: 79.91 mL/min (ref >60.00)
Glucose, Bld: 121 mg/dL — ABNORMAL HIGH (ref 70–99)
Potassium: 4 mEq/L (ref 3.5–5.1)
Sodium: 136 mEq/L (ref 135–145)
Total Bilirubin: 0.4 mg/dL (ref 0.2–1.2)
Total Protein: 7.6 g/dL (ref 6.0–8.3)

## 2022-05-14 LAB — LIPID PANEL
Cholesterol: 122 mg/dL (ref 0–200)
HDL: 44.3 mg/dL (ref >39.00)
LDL Cholesterol: 61 mg/dL (ref 0–99)
NonHDL: 77.7
Total CHOL/HDL Ratio: 3
Triglycerides: 86 mg/dL (ref 0.0–149.0)
VLDL: 17.2 mg/dL (ref 0.0–40.0)

## 2022-05-14 LAB — HEMOGLOBIN A1C: Hgb A1c MFr Bld: 7.2 % — ABNORMAL HIGH (ref 4.6–6.5)

## 2022-05-14 MED ORDER — METFORMIN HCL ER 500 MG PO TB24: 500.0000 mg | ORAL_TABLET | Freq: Every day | ORAL | 3 refills | Status: DC

## 2022-05-14 NOTE — Progress Notes (Signed)
Subjective:   Chief Complaint  Patient presents with   Follow-up    3 month DM    Kaitlyn Stevens is a 47 y.o. female here for follow-up of diabetes.   [name] does not routinely check her sugars.  Patient does not require insulin.   Medications include: Diet controlled for now, was going to start metformin.  Diet is OK. Decreased portions.  Exercise: walking  Hypertension Patient presents for hypertension follow up. She does monitor home blood pressures. Blood pressures ranging on average from 120-140's/60-70's. She is compliant with medications. Patient has these side effects of medication: none Diet/exercise as above. No Cp or SOB.    Past Medical History:  Diagnosis Date   Allergy    Essential hypertension    Hemorrhoids    History of blood transfusion 1996   with c/s done in [place]   SVD (spontaneous vaginal delivery) 1994   twins     Related testing: Retinal exam: Done Pneumovax: done  Objective:  BP 112/68 (BP Location: Left Arm, Patient Position: Sitting, Cuff Size: Normal)   Pulse 62   Temp 97.8 F (36.6 C) (Oral)   Ht 5' 4.5" (1.638 m)   Wt 193 lb 6 oz (87.7 kg)   SpO2 93%   BMI 32.68 kg/m  General:  Well developed, well nourished, in no apparent distress Lungs:  CTAB, no access msc use Cardio:  RRR, no bruits, no LE edema Psych: Age appropriate judgment and insight  Assessment:   Diabetes mellitus type 2 in obese ([place]) - Plan: Comprehensive metabolic panel, Hemoglobin A1c, Lipid panel  Essential hypertension  Low HDL (under 40)  Need for Tdap vaccination - Plan: Tdap vaccine greater than or equal to 7yo IM   Plan:   Chronic, unsure if stable. Ck A1c. Will consider Metformin XR 500 mg/d if not controlled. Goal is <7. Cont Lipitor 40 mg/d. Counseled on diet and exercise. Chronic, unsure if controlled. Will cont 5-160-25 mg of amlodipine-valsartan-HCZ daily. Monitor BP at home. Send readings in 3-4 weeks.  Ck cholesterol. Tdap today.  F/u in  3-6 mo pending above.  The patient voiced understanding and agreement to the plan.  [place], DO 05/14/22 7:39 AM

## 2022-05-14 NOTE — Patient Instructions (Signed)
Give us 2-3 business days to get the results of your labs back.   Keep the diet clean and stay active.  Let us know if you need anything. 

## 2022-08-15 ENCOUNTER — Telehealth: Payer: Self-pay

## 2022-08-15 ENCOUNTER — Encounter: Payer: No Typology Code available for payment source | Admitting: Family Medicine

## 2022-08-15 NOTE — Telephone Encounter (Signed)
Caller Name Danyeal Gamel Caller Phone Number [phone] Patient Name Kaitlyn Stevens Patient DOB August 24, 1975 Call Type Message Only Information Provided Reason for Call Request to Reschedule Office Appointment Initial Comment Caller States she needs to reschedule her appt Disp. Time Disposition Final User 08/15/2022 6:31:12 AM General Information Provided Yes [place], Brionn Call Closed By: Ardeth Sportsman Transaction Date/Time: 08/15/2022 6:29:09 AM (ET)

## 2022-08-18 NOTE — Telephone Encounter (Signed)
Appt rescheduled

## 2022-08-19 ENCOUNTER — Encounter: Payer: Self-pay | Admitting: Family Medicine

## 2022-08-19 ENCOUNTER — Ambulatory Visit (INDEPENDENT_AMBULATORY_CARE_PROVIDER_SITE_OTHER): Payer: No Typology Code available for payment source | Admitting: Family Medicine

## 2022-08-19 VITALS — BP 122/78 | HR 92 | Temp 98.7°F | Ht 64.0 in | Wt 189.4 lb

## 2022-08-19 DIAGNOSIS — D500 Iron deficiency anemia secondary to blood loss (chronic): Secondary | ICD-10-CM | POA: Diagnosis not present

## 2022-08-19 LAB — IBC + FERRITIN
Ferritin: 8.2 ng/mL — ABNORMAL LOW (ref 10.0–291.0)
Iron: 22 ug/dL — ABNORMAL LOW (ref 42–145)
Saturation Ratios: 4.5 % — ABNORMAL LOW (ref 20.0–50.0)
TIBC: 494.2 ug/dL — ABNORMAL HIGH (ref 250.0–450.0)
Transferrin: 353 mg/dL (ref 212.0–360.0)

## 2022-08-19 LAB — CBC
HCT: 31.5 % — ABNORMAL LOW (ref 36.0–46.0)
Hemoglobin: 10 g/dL — ABNORMAL LOW (ref 12.0–15.0)
MCHC: 31.9 g/dL (ref 30.0–36.0)
MCV: 70 fl — ABNORMAL LOW (ref 78.0–100.0)
Platelets: 339 10*3/uL (ref 150.0–400.0)
RBC: 4.5 Mil/uL (ref 3.87–5.11)
RDW: 24.2 % — ABNORMAL HIGH (ref 11.5–15.5)
WBC: 8.1 10*3/uL (ref 4.0–10.5)

## 2022-08-19 NOTE — Patient Instructions (Signed)
Give [place] 2-3 business days to get the results of your labs back.   If you do not hear anything about your referral in the next 1-2 weeks, call our office and ask for an update.  Take an over the counter iron supplement (Ferrous sulfate) 1-2 times daily as your stomach will allow  Iron Rich foods:  Red meat Prunes Spinach Liver Cream of Wheat  Moderate sources: Iron-fortified cereals (bran, etc) Almonds Beans/peas Pork Lamb Ham Scallops [place] Peaches Peanuts  Let us know if you need anything.

## 2022-08-19 NOTE — Progress Notes (Signed)
Chief Complaint  Patient presents with   Follow-up    ED-passes out     Subjective: Patient is a 47 y.o. female here for ED f/u.  Patient went to the emergency department 2 days ago after feeling lightheaded.  She was found to have a low hemoglobin.  She has been having bleeding from a menstrual cycle in addition to the blood from her stool.  She was recently diagnosed with Crohn's disease.  The GI doctor referred her to the hematology team for possible iron infusions but she heard something about the cancer center and decided not to schedule the appointment 2/2 confusion.  She does not routinely take iron supplementation.  She is eating and drinking normally.  Past Medical History:  Diagnosis Date   Allergy    Essential hypertension    Hemorrhoids    History of blood transfusion 1996   with c/s done in [place]   SVD (spontaneous vaginal delivery) 1994   twins    Objective: BP 122/78 (BP Location: Left Arm, Patient Position: Sitting, Cuff Size: Normal)   Pulse 92   Temp 98.7 F (37.1 C) (Oral)   Ht 5\' 4"  (1.626 m)   Wt 189 lb 6 oz (85.9 kg)   SpO2 95%   BMI 32.51 kg/m  General: Awake, appears stated age Heart: RRR, no LE edema, brisk capillary refill Lungs: CTAB, no rales, wheezes or rhonchi. No accessory muscle use Eyes: No scleral icterus Mouth: MMM, no subglossal icterus Psych: Age appropriate judgment and insight, normal affect and mood  Assessment and Plan: Iron deficiency anemia due to chronic blood loss - Plan: Ambulatory referral to Hematology / Oncology, IBC + Ferritin, CBC  Refer to the hematology team, explained that this was more for low iron/hemoglobin levels rather than a cancer diagnosis.  Check blood work today.  Hemoglobin was 7.2 at [facility] ER.  She received 2 units of packed red blood cells.  A list of iron rich foods were provided in paperwork in addition to recommendations for over-the-counter Fe supplementation.  Follow-up in 4 weeks for her diabetes  visit. The patient voiced understanding and agreement to the plan.  Jilda Roche [place], DO 08/19/22  10:42 AM

## 2022-08-22 ENCOUNTER — Other Ambulatory Visit: Payer: Self-pay

## 2022-08-22 ENCOUNTER — Inpatient Hospital Stay: Payer: No Typology Code available for payment source

## 2022-08-22 ENCOUNTER — Inpatient Hospital Stay: Payer: No Typology Code available for payment source | Attending: Family | Admitting: Family

## 2022-08-22 ENCOUNTER — Encounter: Payer: Self-pay | Admitting: Family

## 2022-08-22 VITALS — BP 112/70 | HR 88 | Temp 98.3°F | Resp 18 | Ht 64.0 in | Wt 188.4 lb

## 2022-08-22 DIAGNOSIS — D500 Iron deficiency anemia secondary to blood loss (chronic): Secondary | ICD-10-CM | POA: Insufficient documentation

## 2022-08-22 DIAGNOSIS — I10 Essential (primary) hypertension: Secondary | ICD-10-CM | POA: Insufficient documentation

## 2022-08-22 DIAGNOSIS — K50911 Crohn's disease, unspecified, with rectal bleeding: Secondary | ICD-10-CM | POA: Insufficient documentation

## 2022-08-22 NOTE — Progress Notes (Signed)
Hematology/Oncology Consultation   Name: Kaitlyn Stevens      MRN: [id number]    Location: Room/bed info not found  Date: 08/22/2022 Time:9:32 AM   REFERRING PHYSICIAN: Arva Chafe, DO   REASON FOR CONSULT:  Iron deficiency anemia    DIAGNOSIS: Iron deficiency anemia secondary to GI blood loss and heavy cycle  HISTORY OF PRESENT ILLNESS: Kaitlyn Stevens is a very pleasant 47 yo African American female with iron deficiency anemia secondary to GI blood loss with Crohn's and heavy irregular cycles.  She required blood transfusion after her second twins were born and most recently on 08/17/2022 with anemia.  Repeat labs on 4/9 show Hgb 10.0, MCV 70. Iron saturation is 4% and ferritin 8.  Prior to transfusion she had a syncopal episode with fatigue, SOB and chest pain that brought her to the ED.  No known familial history of anemia. No sickle cell.  She had 2 sets of twins and then tubal ligation. No history of miscarriage.  She is borderline diabetic, not treated at this time. No history of thyroid disease.  No fever, chills, n/v, cough, rash, dizziness, SOB, chest pain, palpitations, abdominal pain or changes in bowel or bladder habits.  No swelling, tenderness, numbness or tingling in her extremities.  No falls or syncope.  Appetite and hydration are good. Weight is stable at 188 lbs.  No smoking or recreational drug use.  Rare ETOH socially.  She stays busy watching her grandson on Fridays and also works as a Building services engineer.   ROS: All other 10 point review of systems is negative.   PAST MEDICAL HISTORY:   Past Medical History:  Diagnosis Date   Allergy    Essential hypertension    Hemorrhoids    History of blood transfusion 1996   with c/s done in [place]   SVD (spontaneous vaginal delivery) 1994   twins    ALLERGIES: No Known Allergies    MEDICATIONS:  Current Outpatient Medications on File Prior to Visit  Medication Sig Dispense Refill   amLODIPine-Valsartan-HCTZ  5-160-25 MG TABS Take 1 tablet by mouth daily. 90 tablet 2   atorvastatin (LIPITOR) 40 MG tablet TAKE 1 TABLET BY MOUTH EVERY DAY 90 tablet 3   fluticasone (FLONASE) 50 MCG/ACT nasal spray Place 2 sprays into both nostrils daily. 16 g 2   loratadine (CLARITIN) 10 MG tablet Take 1 tablet (10 mg total) by mouth daily. 30 tablet 11   hydrocortisone (ANUSOL-HC) 25 MG suppository Place 1 suppository (25 mg total) rectally at bedtime. (Patient not taking: Reported on 08/22/2022) 12 suppository 1   No current facility-administered medications on file prior to visit.     PAST SURGICAL HISTORY Past Surgical History:  Procedure Laterality Date   CESAREAN SECTION  05/12/1994   x 1 twins   CHOLECYSTECTOMY N/A 07/16/2017   Procedure: LAPAROSCOPIC CHOLECYSTECTOMY;  Surgeon: Almond Lint, MD;  Location: [facility];  Service: General;  Laterality: N/A;   NSVD     1st set of twins vaginal delivery   POLYPECTOMY  02/13/2012   Procedure:uterine  POLYPECTOMY with Ablation ;  Surgeon: Mickel Baas, MD;  Location: [facility];  Service: Gynecology;  Laterality: N/A;   TUBAL LIGATION      FAMILY HISTORY: Family History  Problem Relation Age of Onset   Colon polyps Mother    Diabetes Mother    Hypertension Mother    Congenital heart disease Paternal Grandmother    Colon cancer Neg Hx    Esophageal cancer  Neg Hx    Rectal cancer Neg Hx    Stomach cancer Neg Hx     SOCIAL HISTORY:  reports that she has never smoked. She has never used smokeless tobacco. She reports that she does not drink alcohol and does not use drugs.  PERFORMANCE STATUS: The patient's performance status is 1 - Symptomatic but completely ambulatory  PHYSICAL EXAM: Most Recent Vital Signs: Blood pressure 112/70, pulse 88, temperature 98.3 F (36.8 C), temperature source Oral, resp. rate 18, height  (1.626 m), weight 188 lb 6.4 oz (85.5 kg), SpO2 98 %. BP 112/70 (BP Location: Right Arm, Patient Position: Sitting)   Pulse 88    Temp 98.3 F (36.8 C) (Oral)   Resp 18   Ht  (1.626 m)   Wt 188 lb 6.4 oz (85.5 kg)   SpO2 98%   BMI 32.34 kg/m   General Appearance:    Alert, cooperative, no distress, appears stated age  Head:    Normocephalic, without obvious abnormality, atraumatic  Eyes:    PERRL, conjunctiva/corneas clear, EOM's intact, fundi    benign, both eyes        Throat:   Lips, mucosa, and tongue normal; teeth and gums normal  Neck:   Supple, symmetrical, trachea midline, no adenopathy;    thyroid:  no enlargement/tenderness/nodules; no carotid   bruit or JVD  Back:     Symmetric, no curvature, ROM normal, no CVA tenderness  Lungs:     Clear to auscultation bilaterally, respirations unlabored  Chest Wall:    No tenderness or deformity   Heart:    Regular rate and rhythm, S1 and S2 normal, no murmur, rub   or gallop     Abdomen:     Soft, non-tender, bowel sounds active all four quadrants,    no masses, no organomegaly        Extremities:   Extremities normal, atraumatic, no cyanosis or edema  Pulses:   2+ and symmetric all extremities  Skin:   Skin color, texture, turgor normal, no rashes or lesions  Lymph nodes:   Cervical, supraclavicular, and axillary nodes normal  Neurologic:   CNII-XII intact, normal strength, sensation and reflexes    throughout    LABORATORY DATA:  No results found for this or any previous visit (from the past 48 hour(s)).    RADIOGRAPHY: No results found.     PATHOLOGY: None  ASSESSMENT/PLAN: Kaitlyn Stevens is a very pleasant 47 yo African American female with iron deficiency anemia secondary to GI blood loss with Crohn's and heavy irregular cycles. We will get her set up for 3 doses of IV iron starting next week on Monday.  Follow-up in 2 months.   All questions were answered. The patient knows to call the clinic with any problems, questions or concerns. We can certainly see the patient much sooner if necessary.   Eileen Stanford, NP

## 2022-09-22 ENCOUNTER — Other Ambulatory Visit: Payer: Self-pay | Admitting: Family Medicine

## 2022-09-22 ENCOUNTER — Ambulatory Visit (INDEPENDENT_AMBULATORY_CARE_PROVIDER_SITE_OTHER): Payer: No Typology Code available for payment source | Admitting: Family Medicine

## 2022-09-22 ENCOUNTER — Encounter: Payer: Self-pay | Admitting: Family Medicine

## 2022-09-22 VITALS — BP 108/62 | HR 62 | Temp 98.4°F | Ht 64.0 in | Wt 186.5 lb

## 2022-09-22 DIAGNOSIS — E1169 Type 2 diabetes mellitus with other specified complication: Secondary | ICD-10-CM | POA: Diagnosis not present

## 2022-09-22 DIAGNOSIS — Z7984 Long term (current) use of oral hypoglycemic drugs: Secondary | ICD-10-CM

## 2022-09-22 DIAGNOSIS — E669 Obesity, unspecified: Secondary | ICD-10-CM

## 2022-09-22 LAB — MICROALBUMIN / CREATININE URINE RATIO
Creatinine,U: 226 mg/dL
Microalb Creat Ratio: 0.8 mg/g (ref 0.0–30.0)
Microalb, Ur: 1.9 mg/dL (ref 0.0–1.9)

## 2022-09-22 NOTE — Progress Notes (Signed)
Subjective:   Chief Complaint  Patient presents with   Follow-up    1 month    Kaitlyn Stevens is a 47 y.o. female here for follow-up of diabetes.   Kaitlyn Stevens does not check her sugars routinely.  Patient does not require insulin.   Medications include: She is not taking her metformin She is compliant with Lipitor 40 mg daily. Diet is fair.  Exercise: active at home, walking No Cp or SOB.   Past Medical History:  Diagnosis Date   Allergy    Essential hypertension    Hemorrhoids    History of blood transfusion 1996   with c/s done in [place]   SVD (spontaneous vaginal delivery) 1994   twins     Related testing: Retinal exam: Due Pneumovax: done  Objective:  BP 108/62 (BP Location: Left Arm, Patient Position: Sitting, Cuff Size: Normal)   Pulse 62   Temp 98.4 F (36.9 C) (Oral)   Ht 5\' 4"  (1.626 m)   Wt 186 lb 8 oz (84.6 kg)   SpO2 93%   BMI 32.01 kg/m  General:  Well developed, well nourished, in no apparent distress Skin:  Warm, no pallor or diaphoresis Head:  Normocephalic, atraumatic Eyes:  Pupils equal and round, sclera anicteric without injection  Lungs:  CTAB, no access msc use Cardio:  RRR, no bruits, no LE edema Musculoskeletal:  Symmetrical muscle groups noted without atrophy or deformity Neuro:  Sensation intact to pinprick on feet Psych: Age appropriate judgment and insight  Assessment:   Type 2 diabetes mellitus with obesity (HCC)   Plan:   Chronic, hopefully stable.  May need to continue metformin.  Continue Lipitor 40 mg daily.  Encouraged to reach out to her eye doctor to schedule her yearly appointment.  The number for his office was provided in her paperwork.  Counseled on diet and exercise. F/u in 3-6 mo. The patient voiced understanding and agreement to the plan.  Jilda Roche [place], DO 09/22/22 7:37 AM

## 2022-09-22 NOTE — Patient Instructions (Addendum)
Give [place] 2-3 business days to get the results of your labs back.   Keep the diet clean and stay active.  Aim to do some physical exertion for 150 minutes per week. This is typically divided into 5 days per week, 30 minutes per day. The activity should be enough to get your heart rate up. Anything is better than nothing if you have time constraints.  Please call Dr. Hazle Quant for your yearly eye exam: [phone]   Dr. Myna Hidalgo: [phone]   Let us know if you need anything.

## 2022-09-26 ENCOUNTER — Other Ambulatory Visit (INDEPENDENT_AMBULATORY_CARE_PROVIDER_SITE_OTHER): Payer: No Typology Code available for payment source

## 2022-09-26 DIAGNOSIS — Z7984 Long term (current) use of oral hypoglycemic drugs: Secondary | ICD-10-CM | POA: Diagnosis not present

## 2022-09-26 DIAGNOSIS — E1169 Type 2 diabetes mellitus with other specified complication: Secondary | ICD-10-CM

## 2022-09-26 DIAGNOSIS — E669 Obesity, unspecified: Secondary | ICD-10-CM

## 2022-09-26 LAB — HEMOGLOBIN A1C: Hgb A1c MFr Bld: 6.8 % — ABNORMAL HIGH (ref 4.6–6.5)

## 2023-01-23 ENCOUNTER — Other Ambulatory Visit: Payer: Self-pay | Admitting: Family Medicine

## 2023-03-31 ENCOUNTER — Ambulatory Visit (INDEPENDENT_AMBULATORY_CARE_PROVIDER_SITE_OTHER): Payer: No Typology Code available for payment source | Admitting: Family Medicine

## 2023-03-31 ENCOUNTER — Encounter: Payer: Self-pay | Admitting: Family Medicine

## 2023-03-31 VITALS — BP 124/76 | HR 86 | Temp 98.0°F | Resp 16 | Ht 64.0 in | Wt 181.6 lb

## 2023-03-31 DIAGNOSIS — K644 Residual hemorrhoidal skin tags: Secondary | ICD-10-CM | POA: Diagnosis not present

## 2023-03-31 DIAGNOSIS — E669 Obesity, unspecified: Secondary | ICD-10-CM

## 2023-03-31 DIAGNOSIS — E1169 Type 2 diabetes mellitus with other specified complication: Secondary | ICD-10-CM | POA: Diagnosis not present

## 2023-03-31 DIAGNOSIS — Z0000 Encounter for general adult medical examination without abnormal findings: Secondary | ICD-10-CM | POA: Diagnosis not present

## 2023-03-31 LAB — COMPREHENSIVE METABOLIC PANEL
ALT: 11 U/L (ref 0–35)
AST: 18 U/L (ref 0–37)
Albumin: 4 g/dL (ref 3.5–5.2)
Alkaline Phosphatase: 87 U/L (ref 39–117)
BUN: 9 mg/dL (ref 6–23)
CO2: 29 meq/L (ref 19–32)
Calcium: 9.3 mg/dL (ref 8.4–10.5)
Chloride: 100 meq/L (ref 96–112)
Creatinine, Ser: 0.92 mg/dL (ref 0.40–1.20)
GFR: 73.24 mL/min (ref >60.00)
Glucose, Bld: 119 mg/dL — ABNORMAL HIGH (ref 70–99)
Potassium: 3.6 meq/L (ref 3.5–5.1)
Sodium: 136 meq/L (ref 135–145)
Total Bilirubin: 0.4 mg/dL (ref 0.2–1.2)
Total Protein: 8 g/dL (ref 6.0–8.3)

## 2023-03-31 LAB — CBC
HCT: 27.2 % — ABNORMAL LOW (ref 36.0–46.0)
Hemoglobin: 8.4 g/dL — ABNORMAL LOW (ref 12.0–15.0)
MCHC: 30.9 g/dL (ref 30.0–36.0)
MCV: 66.2 fL — ABNORMAL LOW (ref 78.0–100.0)
Platelets: 460 10*3/uL — ABNORMAL HIGH (ref 150.0–400.0)
RBC: 4.11 Mil/uL (ref 3.87–5.11)
RDW: 21.6 % — ABNORMAL HIGH (ref 11.5–15.5)
WBC: 5.7 10*3/uL (ref 4.0–10.5)

## 2023-03-31 LAB — LIPID PANEL
Cholesterol: 106 mg/dL (ref 0–200)
HDL: 37.6 mg/dL — ABNORMAL LOW (ref >39.00)
LDL Cholesterol: 55 mg/dL (ref 0–99)
NonHDL: 68.04
Total CHOL/HDL Ratio: 3
Triglycerides: 65 mg/dL (ref 0.0–149.0)
VLDL: 13 mg/dL (ref 0.0–40.0)

## 2023-03-31 LAB — HEMOGLOBIN A1C: Hgb A1c MFr Bld: 6.6 % — ABNORMAL HIGH (ref 4.6–6.5)

## 2023-03-31 MED ORDER — HYDROCORTISONE (PERIANAL) 2.5 % EX CREA: 1.0000 | TOPICAL_CREAM | Freq: Two times a day (BID) | CUTANEOUS | 0 refills | Status: DC

## 2023-03-31 NOTE — Patient Instructions (Signed)
Give us 2-3 business days to get the results of your labs back.   Keep the diet clean and stay active.  Please consider adding some weight resistance exercise to your routine. Consider yoga as well.   Please get me a copy of your advanced directive form at your convenience.   Let us know if you need anything.  

## 2023-03-31 NOTE — Progress Notes (Signed)
Chief Complaint  Patient presents with   Annual Exam    Annual Exam     Well Woman Kaitlyn Stevens is here for a complete physical.   Her last physical was >1 year ago.  Current diet: in general, a "healthy" diet. Current exercise: walking. Weight is stable and she denies fatigue out of ordinary. Seatbelt? Yes Advanced directive? No  Health Maintenance Pap/HPV- Yes Mammogram- unsure Tetanus- Yes Hep C screening- Yes HIV screening- Yes  Past Medical History:  Diagnosis Date   Allergy    Essential hypertension    Hemorrhoids    History of blood transfusion 1996   with c/s done in [place]   SVD (spontaneous vaginal delivery) 1994   twins     Past Surgical History:  Procedure Laterality Date   CESAREAN SECTION  05/12/1994   x 1 twins   CHOLECYSTECTOMY N/A 07/16/2017   Procedure: LAPAROSCOPIC CHOLECYSTECTOMY;  Surgeon: Almond Lint, MD;  Location: [facility];  Service: General;  Laterality: N/A;   NSVD     1st set of twins vaginal delivery   POLYPECTOMY  02/13/2012   Procedure:uterine  POLYPECTOMY with Ablation ;  Surgeon: Mickel Baas, MD;  Location: [facility];  Service: Gynecology;  Laterality: N/A;   TUBAL LIGATION      Medications  Current Outpatient Medications on File Prior to Visit  Medication Sig Dispense Refill   amLODIPine-Valsartan-HCTZ 5-160-25 MG TABS TAKE 1 TABLET BY MOUTH EVERY DAY 90 tablet 2   atorvastatin (LIPITOR) 40 MG tablet TAKE 1 TABLET BY MOUTH EVERY DAY 90 tablet 3   fluticasone (FLONASE) 50 MCG/ACT nasal spray Place 2 sprays into both nostrils daily. 16 g 2   hydrocortisone (ANUSOL-HC) 25 MG suppository Place 1 suppository (25 mg total) rectally at bedtime. 12 suppository 1   loratadine (CLARITIN) 10 MG tablet Take 1 tablet (10 mg total) by mouth daily. 30 tablet 11   Allergies No Known Allergies  Review of Systems: Constitutional:  no unexpected weight changes Eye:  no recent significant change in vision Ear/Nose/Mouth/Throat:  Ears:  no  recent change in hearing Nose/Mouth/Throat:  no complaints of nasal congestion, no sore throat Cardiovascular: no chest pain Respiratory:  no shortness of breath Gastrointestinal:  no new abdominal pain, no new change in bowel habits GU:  Female: negative for dysuria or pelvic pain Musculoskeletal/Extremities:  no pain of the joints Integumentary (Skin/Breast):  no abnormal skin lesions reported Neurologic:  no headaches Endocrine:  denies fatigue Hematologic/Lymphatic:  No areas of easy bleeding  Exam BP 124/76 (BP Location: Left Arm, Patient Position: Sitting, Cuff Size: Normal)   Pulse 86   Temp 98 F (36.7 C) (Oral)   Resp 16   Ht 5\' 4"  (1.626 m)   Wt 181 lb 9.6 oz (82.4 kg)   SpO2 95%   BMI 31.17 kg/m  General:  well developed, well nourished, in no apparent distress Skin:  no significant moles, warts, or growths Head:  no masses, lesions, or tenderness Eyes:  pupils equal and round, sclera anicteric without injection Ears:  canals without lesions, TMs shiny without retraction, no obvious effusion, no erythema Nose:  nares patent, mucosa normal, and no drainage Throat/Pharynx:  lips and gingiva without lesion; tongue and uvula midline; non-inflamed pharynx; no exudates or postnasal drainage Neck: neck supple without adenopathy, thyromegaly, or masses Lungs:  clear to auscultation, breath sounds equal bilaterally, no respiratory distress Cardio:  regular rate and rhythm, no LE edema Abdomen:  abdomen soft, nontender; bowel sounds normal;  no masses or organomegaly Genital: Defer to GYN Musculoskeletal:  symmetrical muscle groups noted without atrophy or deformity Extremities:  no clubbing, cyanosis, or edema, no deformities, no skin discoloration Neuro:  gait normal; deep tendon reflexes normal and symmetric Psych: well oriented with normal range of affect and appropriate judgment/insight  Assessment and Plan  Well adult exam - Plan: CBC, Comprehensive metabolic panel,  Lipid panel  Type 2 diabetes mellitus with obesity (HCC) - Plan: Hemoglobin A1c   Well 47 y.o. female. Counseled on diet and exercise. Flu shot politely declined.  Rec'd she schedule her eye exam. Will request records from [facility].  Advanced directive form provided today.  Other orders as above. Follow up in 6 mo. The patient voiced understanding and agreement to the plan.  Jilda Roche [place], DO 03/31/23 7:47 AM

## 2023-04-01 ENCOUNTER — Ambulatory Visit (INDEPENDENT_AMBULATORY_CARE_PROVIDER_SITE_OTHER): Payer: No Typology Code available for payment source

## 2023-04-01 ENCOUNTER — Other Ambulatory Visit: Payer: Self-pay

## 2023-04-01 DIAGNOSIS — D500 Iron deficiency anemia secondary to blood loss (chronic): Secondary | ICD-10-CM

## 2023-04-01 LAB — IBC + FERRITIN
Ferritin: 2.8 ng/mL — ABNORMAL LOW (ref 10.0–291.0)
Iron: 18 ug/dL — ABNORMAL LOW (ref 42–145)
Saturation Ratios: 3.6 % — ABNORMAL LOW (ref 20.0–50.0)
TIBC: 495.6 ug/dL — ABNORMAL HIGH (ref 250.0–450.0)
Transferrin: 354 mg/dL (ref 212.0–360.0)

## 2023-05-01 ENCOUNTER — Telehealth: Payer: Self-pay

## 2023-05-01 ENCOUNTER — Other Ambulatory Visit (INDEPENDENT_AMBULATORY_CARE_PROVIDER_SITE_OTHER): Payer: No Typology Code available for payment source

## 2023-05-01 DIAGNOSIS — D500 Iron deficiency anemia secondary to blood loss (chronic): Secondary | ICD-10-CM

## 2023-05-01 LAB — CBC WITH DIFFERENTIAL/PLATELET
Basophils Absolute: 0 10*3/uL (ref 0.0–0.1)
Basophils Relative: 0.5 % (ref 0.0–3.0)
Eosinophils Absolute: 0.2 10*3/uL (ref 0.0–0.7)
Eosinophils Relative: 3.3 % (ref 0.0–5.0)
HCT: 30.6 % — ABNORMAL LOW (ref 36.0–46.0)
Hemoglobin: 9.6 g/dL — ABNORMAL LOW (ref 12.0–15.0)
Lymphocytes Relative: 41.8 % (ref 12.0–46.0)
Lymphs Abs: 2.5 10*3/uL (ref 0.7–4.0)
MCHC: 31.2 g/dL (ref 30.0–36.0)
MCV: 70.4 fL — ABNORMAL LOW (ref 78.0–100.0)
Monocytes Absolute: 0.6 10*3/uL (ref 0.1–1.0)
Monocytes Relative: 10.7 % (ref 3.0–12.0)
Neutro Abs: 2.6 10*3/uL (ref 1.4–7.7)
Neutrophils Relative %: 43.7 % (ref 43.0–77.0)
Platelets: 437 10*3/uL — ABNORMAL HIGH (ref 150.0–400.0)
RBC: 4.35 Mil/uL (ref 3.87–5.11)
RDW: 26 % — ABNORMAL HIGH (ref 11.5–15.5)
WBC: 6 10*3/uL (ref 4.0–10.5)

## 2023-05-01 LAB — IBC + FERRITIN
Ferritin: 5.6 ng/mL — ABNORMAL LOW (ref 10.0–291.0)
Iron: 58 ug/dL (ref 42–145)
Saturation Ratios: 11.7 % — ABNORMAL LOW (ref 20.0–50.0)
TIBC: 495.6 ug/dL — ABNORMAL HIGH (ref 250.0–450.0)
Transferrin: 354 mg/dL (ref 212.0–360.0)

## 2023-05-01 NOTE — Telephone Encounter (Signed)
Recheck her CBC/Fe levels at her f/u.

## 2023-06-23 ENCOUNTER — Other Ambulatory Visit: Payer: Self-pay | Admitting: Family Medicine

## 2023-06-23 DIAGNOSIS — E669 Obesity, unspecified: Secondary | ICD-10-CM

## 2023-06-23 DIAGNOSIS — K644 Residual hemorrhoidal skin tags: Secondary | ICD-10-CM

## 2023-06-23 MED ORDER — ATORVASTATIN CALCIUM 40 MG PO TABS: 40.0000 mg | ORAL_TABLET | Freq: Every day | ORAL | 3 refills | Status: AC

## 2023-06-23 MED ORDER — HYDROCORTISONE (PERIANAL) 2.5 % EX CREA: 1.0000 | TOPICAL_CREAM | Freq: Two times a day (BID) | CUTANEOUS | 0 refills | Status: AC

## 2023-06-23 NOTE — Telephone Encounter (Signed)
Copied from CRM [id number]. Topic: Clinical - Medication Refill >> Jun 23, 2023 11:50 AM Denese Killings wrote: Most Recent Primary Care Visit:  Provider: [facility]  Department: [facility]  Visit Type: LAB  Date: 05/01/2023  Medication: hydrocortisone (ANUSOL-HC) 2.5 % rectal cream  Has the patient contacted their pharmacy? No (Agent: If no, request that the patient contact the pharmacy for the refill. If patient does not wish to contact the pharmacy document the reason why and proceed with request.) (Agent: If yes, when and what did the pharmacy advise?)  Is this the correct pharmacy for this prescription? Yes If no, delete pharmacy and type the correct one.  This is the patient's preferred pharmacy:  [id number] [place], [place] - [address] [place] [address] Phone: [phone] Fax: [phone]  Has the prescription been filled recently? No  Is the patient out of the medication? Yes  Has the patient been seen for an appointment in the last year OR does the patient have an upcoming appointment? Yes  Can we respond through MyChart? Yes  Agent: Please be advised that Rx refills may take up to 3 business days. We ask that you follow-up with your pharmacy.

## 2023-06-23 NOTE — Telephone Encounter (Signed)
Copied from CRM [id number]. Topic: Clinical - Prescription Issue >> Jun 23, 2023 12:01 PM Denese Killings wrote: Reason for CRM: RX refill request wasn't working for atorvastatin (LIPITOR) 40 MG tablet due to replace diagnosis popping up.

## 2023-11-18 ENCOUNTER — Other Ambulatory Visit: Payer: Self-pay | Admitting: Family Medicine

## 2024-03-23 ENCOUNTER — Telehealth: Payer: Self-pay | Admitting: Family Medicine

## 2024-03-23 DIAGNOSIS — J302 Other seasonal allergic rhinitis: Secondary | ICD-10-CM

## 2024-03-23 MED ORDER — FLUTICASONE PROPIONATE 50 MCG/ACT NA SUSP: 2.0000 | Freq: Every day | NASAL | 0 refills | Status: AC

## 2024-03-23 NOTE — Telephone Encounter (Signed)
 Rx sent.

## 2024-03-23 NOTE — Telephone Encounter (Signed)
 Copied from CRM [id number]. Topic: Clinical - Medication Refill >> Mar 23, 2024  1:55 PM Alfonso HERO wrote: Reason for RMF:ejupzwu is out of town and needs  fluticasone  (FLONASE ) 50 MCG/ACT nasal spray sent to another pharmacy.    [id number] [place], [place] - [address] [facility] [address] [phone] [address] [place] [address]

## 2024-03-30 ENCOUNTER — Ambulatory Visit: Payer: Self-pay

## 2024-03-30 NOTE — Telephone Encounter (Signed)
 FYI Only or Action Required?: Action required by provider: clinical question for provider.  Patient was last seen in primary care on 03/31/2023 by Frann Mabel Mt, DO.  Called Nurse Triage reporting Cough.  Symptoms began 3 to 4 days.  Interventions attempted: Nothing.  Symptoms are: unchanged.  Triage Disposition: See PCP When Office is Open (Within 3 Days)  Patient/caregiver understands and will follow disposition?: No, wishes to speak with PCP  Copied from CRM #[id number]. Topic: Clinical - Medication Question >> Mar 30, 2024 12:54 PM Franky GRADE wrote: Reason for CRM: Patient is requesting cough medication for a lingering cough she developed 4-3 days ago. She was given the fluticasone  (FLONASE ) 50 MCG/ACT nasal spray on 03/23/2024 which helped with the runny nose. Reason for Disposition  Cough has been present for > 3 weeks  Answer Assessment - Initial Assessment Questions 1. ONSET: When did the cough begin?      3 to 4 days 2. SEVERITY: How bad is the cough today?      Coughing spells 3. SPUTUM: Describe the color of your sputum (e.g., none, dry cough; clear, white, yellow, green)     Dry nagging cough 4. HEMOPTYSIS: Are you coughing up any blood? If Yes, ask: How much? (e.g., flecks, streaks, tablespoons, etc.)     no 5. DIFFICULTY BREATHING: Are you having difficulty breathing? If Yes, ask: How bad is it? (e.g., mild, moderate, severe)      no 6. FEVER: Do you have a fever? If Yes, ask: What is your temperature, how was it measured, and when did it start?     no 7. CARDIAC HISTORY: Do you have any history of heart disease? (e.g., heart attack, congestive heart failure)      na 8. LUNG HISTORY: Do you have any history of lung disease?  (e.g., pulmonary embolus, asthma, emphysema)     na 9. PE RISK FACTORS: Do you have a history of blood clots? (or: recent major surgery, recent prolonged travel, bedridden)     na 10. OTHER SYMPTOMS: Do you have  any other symptoms? (e.g., runny nose, wheezing, chest pain)       no 11. PREGNANCY: Is there any chance you are pregnant? When was your last menstrual period?       na 12. TRAVEL: Have you traveled out of the country in the last month? (e.g., travel history, exposures)       Na  Pt states tessalon  pearls do not work for pt: pt stated she would prefer something else and hopefully some type of liquid medication for the cough.  Pt would like medication sent to her local pharmacy located at: [id number] [place], [place] - [address]  Protocols used: Cough - Acute Productive-A-AH

## 2024-03-30 NOTE — Telephone Encounter (Signed)
 Call pt and sent message for her to call setup appointment to discuss.

## 2024-04-14 ENCOUNTER — Other Ambulatory Visit: Payer: Self-pay | Admitting: Family Medicine

## 2024-04-14 DIAGNOSIS — J302 Other seasonal allergic rhinitis: Secondary | ICD-10-CM
